# Patient Record
Sex: Female | Born: 1985 | Race: Black or African American | Hispanic: No | Marital: Single | Smoking: Never smoker
Health system: Southern US, Community
[De-identification: ages and names within clinical notes are randomized; demographics above are authoritative.]

## PROBLEM LIST (undated history)

## (undated) ENCOUNTER — Inpatient Hospital Stay (HOSPITAL_COMMUNITY): Payer: Self-pay

## (undated) DIAGNOSIS — R87629 Unspecified abnormal cytological findings in specimens from vagina: Secondary | ICD-10-CM

## (undated) DIAGNOSIS — E669 Obesity, unspecified: Secondary | ICD-10-CM

## (undated) DIAGNOSIS — I10 Essential (primary) hypertension: Secondary | ICD-10-CM

## (undated) HISTORY — PX: CHOLECYSTECTOMY: SHX55

## (undated) HISTORY — PX: LEEP: SHX91

## (undated) HISTORY — PX: BARIATRIC SURGERY: SHX1103

---

## 2005-04-23 ENCOUNTER — Emergency Department (HOSPITAL_COMMUNITY): Admission: EM | Admit: 2005-04-23 | Discharge: 2005-04-23 | Payer: Self-pay | Admitting: Emergency Medicine

## 2009-08-25 ENCOUNTER — Emergency Department (HOSPITAL_BASED_OUTPATIENT_CLINIC_OR_DEPARTMENT_OTHER): Admission: EM | Admit: 2009-08-25 | Discharge: 2009-08-26 | Payer: Self-pay | Admitting: Emergency Medicine

## 2009-08-26 ENCOUNTER — Ambulatory Visit: Payer: Self-pay | Admitting: Diagnostic Radiology

## 2010-07-27 LAB — CBC
HCT: 34.2 % — ABNORMAL LOW (ref 36.0–46.0)
Hemoglobin: 11.4 g/dL — ABNORMAL LOW (ref 12.0–15.0)
WBC: 6.7 10*3/uL (ref 4.0–10.5)

## 2010-07-27 LAB — GC/CHLAMYDIA PROBE AMP, GENITAL
Chlamydia, DNA Probe: NEGATIVE
GC Probe Amp, Genital: NEGATIVE

## 2010-07-27 LAB — DIFFERENTIAL
Basophils Absolute: 0.1 10*3/uL (ref 0.0–0.1)
Basophils Relative: 1 % (ref 0–1)
Lymphs Abs: 2.9 10*3/uL (ref 0.7–4.0)
Monocytes Absolute: 0.3 10*3/uL (ref 0.1–1.0)

## 2010-07-27 LAB — WET PREP, GENITAL

## 2010-07-27 LAB — PREGNANCY, URINE: Preg Test, Ur: NEGATIVE

## 2010-09-13 ENCOUNTER — Emergency Department (HOSPITAL_BASED_OUTPATIENT_CLINIC_OR_DEPARTMENT_OTHER)
Admission: EM | Admit: 2010-09-13 | Discharge: 2010-09-13 | Disposition: A | Discharge status: Home / Self Care | Payer: Self-pay | Attending: Emergency Medicine | Admitting: Emergency Medicine

## 2010-09-13 DIAGNOSIS — N760 Acute vaginitis: Secondary | ICD-10-CM | POA: Insufficient documentation

## 2010-09-13 LAB — WET PREP, GENITAL
Clue Cells Wet Prep HPF POC: NONE SEEN
Trich, Wet Prep: NONE SEEN

## 2011-04-17 ENCOUNTER — Emergency Department (HOSPITAL_BASED_OUTPATIENT_CLINIC_OR_DEPARTMENT_OTHER)
Admission: EM | Admit: 2011-04-17 | Discharge: 2011-04-17 | Disposition: A | Discharge status: Home / Self Care | Payer: Self-pay | Attending: Emergency Medicine | Admitting: Emergency Medicine

## 2011-04-17 ENCOUNTER — Encounter: Payer: Self-pay | Admitting: *Deleted

## 2011-04-17 DIAGNOSIS — L089 Local infection of the skin and subcutaneous tissue, unspecified: Secondary | ICD-10-CM | POA: Insufficient documentation

## 2011-04-17 MED ORDER — CEPHALEXIN 500 MG PO CAPS: 500.0000 mg | ORAL_CAPSULE | Freq: Four times a day (QID) | ORAL | Status: AC

## 2011-04-17 MED ORDER — HYDROCODONE-ACETAMINOPHEN 5-325 MG PO TABS: 2.0000 | ORAL_TABLET | ORAL | Status: AC | PRN

## 2011-04-17 NOTE — ED Provider Notes (Signed)
Evaluation and management procedures were performed by the PA/NP under my supervision/collaboration.    Felisa Bonier, MD 04/17/11 [phone]

## 2011-04-17 NOTE — ED Provider Notes (Signed)
History     CSN: [id number] Arrival date & time: 04/17/2011  8:52 PM   First MD Initiated Contact with Patient 04/17/11 2135      Chief Complaint  Patient presents with  . Hand Pain    (Consider location/radiation/quality/duration/timing/severity/associated sxs/prior treatment) Patient is a 25 y.o. female presenting with hand pain. The history is provided by the patient. No language interpreter was used.  Hand Pain This is a new problem. The current episode started in the past 7 days. The problem occurs constantly. The problem has been unchanged. Associated symptoms include joint swelling. The symptoms are aggravated by nothing. She has tried heat for the symptoms. The treatment provided no relief.  Pt complains of swelling and pain to the tip of her finger.  Pt has been soaking without relief  History reviewed. No pertinent past medical history.  History reviewed. No pertinent past surgical history.  History reviewed. No pertinent family history.  History  Substance Use Topics  . Smoking status: Not on file  . Smokeless tobacco: Not on file  . Alcohol Use: Not on file    OB History    Grav Para Term Preterm Abortions TAB SAB Ect Mult Living                  Review of Systems  Musculoskeletal: Positive for joint swelling.  All other systems reviewed and are negative.    Allergies  Darvocet  Home Medications   Current Outpatient Rx  Name Route Sig Dispense Refill  . ACETAMINOPHEN 500 MG PO TABS Oral Take 1,000 mg by mouth every 6 (six) hours as needed. For pain     . ALBUTEROL SULFATE HFA 108 (90 BASE) MCG/ACT IN AERS Inhalation Inhale 2 puffs into the lungs every 6 (six) hours as needed. For shortness of breath and wheezing     . IBUPROFEN 200 MG PO TABS Oral Take 600 mg by mouth every 6 (six) hours as needed. For pain       BP 121/68  Pulse 93  Temp 98.1 F (36.7 C)  Resp 18  Ht 5\' 2"  (1.575 m)  Wt 250 lb (113.399 kg)  BMI 45.73 kg/m2  SpO2 100%  LMP  03/31/2011  Physical Exam  Vitals reviewed. Constitutional: She appears well-developed.  HENT:  Head: Normocephalic.  Musculoskeletal: She exhibits edema and tenderness.       Tender tip of 4th finger,  No obv pus pocket,    Skin: Skin is warm.  Psychiatric: She has a normal mood and affect.    ED Course  Procedures (including critical care time)  Labs Reviewed - No data to display No results found.   No diagnosis found.    MDM  Pt advised soak.  I will treat with keflex.  Pt request medication for pain        Langston Masker, [place] 04/17/11 2225

## 2011-04-17 NOTE — ED Notes (Signed)
Pt states she has had pain and swelling to her right ring finger since Wed. Types for a living. Tried soaks and OTC meds without relief.

## 2011-04-24 ENCOUNTER — Encounter (HOSPITAL_BASED_OUTPATIENT_CLINIC_OR_DEPARTMENT_OTHER): Payer: Self-pay | Admitting: *Deleted

## 2011-04-24 ENCOUNTER — Emergency Department (HOSPITAL_BASED_OUTPATIENT_CLINIC_OR_DEPARTMENT_OTHER)
Admission: EM | Admit: 2011-04-24 | Discharge: 2011-04-24 | Disposition: A | Discharge status: Home / Self Care | Payer: Self-pay | Attending: Emergency Medicine | Admitting: Emergency Medicine

## 2011-04-24 DIAGNOSIS — IMO0002 Reserved for concepts with insufficient information to code with codable children: Secondary | ICD-10-CM | POA: Insufficient documentation

## 2011-04-24 MED ORDER — LIDOCAINE HCL 2 % IJ SOLN
INTRAMUSCULAR | Status: AC
  Filled 2011-04-24: qty 1

## 2011-04-24 MED ORDER — LIDOCAINE HCL 2 % IJ SOLN: 20.0000 mL | Freq: Once | INTRAMUSCULAR | Status: DC

## 2011-04-24 NOTE — ED Provider Notes (Signed)
History     CSN: [id number] Arrival date & time: 04/24/2011  2:45 PM   First MD Initiated Contact with Patient 04/24/11 1508      Chief Complaint  Patient presents with  . Hand Pain    (Consider location/radiation/quality/duration/timing/severity/associated sxs/prior treatment) Patient is a 25 y.o. female presenting with hand pain. The history is provided by the patient. No language interpreter was used.  Hand Pain This is a new problem. The current episode started in the past 7 days. The problem occurs constantly. The problem has been gradually worsening. Pertinent negatives include no fever. Exacerbated by: palpation. Treatments tried: keflex. The treatment provided no relief.    History reviewed. No pertinent past medical history.  History reviewed. No pertinent past surgical history.  History reviewed. No pertinent family history.  History  Substance Use Topics  . Smoking status: Not on file  . Smokeless tobacco: Not on file  . Alcohol Use: Not on file    OB History    Grav Para Term Preterm Abortions TAB SAB Ect Mult Living                  Review of Systems  Constitutional: Negative.  Negative for fever.  Eyes: Negative.   Respiratory: Negative.   Cardiovascular: Negative.   Skin: Positive for wound.    Allergies  Darvocet  Home Medications   Current Outpatient Rx  Name Route Sig Dispense Refill  . ACETAMINOPHEN 500 MG PO TABS Oral Take 1,000 mg by mouth every 6 (six) hours as needed. For pain     . ALBUTEROL SULFATE HFA 108 (90 BASE) MCG/ACT IN AERS Inhalation Inhale 2 puffs into the lungs every 6 (six) hours as needed. For shortness of breath and wheezing     . CEPHALEXIN 500 MG PO CAPS Oral Take 1 capsule (500 mg total) by mouth 4 (four) times daily. 28 capsule 0  . HYDROCODONE-ACETAMINOPHEN 5-325 MG PO TABS Oral Take 2 tablets by mouth every 4 (four) hours as needed for pain. 10 tablet 0  . IBUPROFEN 200 MG PO TABS Oral Take 600 mg by mouth every 6  (six) hours as needed. For pain       BP 124/86  Pulse 96  Temp(Src) 98.9 F (37.2 C) (Oral)  Resp 18  Ht 5\' 2"  (1.575 m)  Wt 250 lb (113.399 kg)  BMI 45.73 kg/m2  SpO2 100%  LMP 04/07/2011  Physical Exam  Nursing note and vitals reviewed. Constitutional: She appears well-developed and well-nourished.  Cardiovascular: Normal rate and regular rhythm.   Pulmonary/Chest: Effort normal and breath sounds normal.  Musculoskeletal: Normal range of motion.  Neurological: She is alert.  Skin:       Pt has a fluctuant are to medial aspect of the right ring fingernail that is yellowish green in color    ED Course  Drain paronychia Date/Time: 04/24/2011 4:29 PM Performed by: Teressa Lower Authorized by: Teressa Lower Consent: Verbal consent obtained. Written consent not obtained. Risks and benefits: risks, benefits and alternatives were discussed Consent given by: patient Patient understanding: patient states understanding of the procedure being performed Patient identity confirmed: verbally with patient Time out: Immediately prior to procedure a "time out" was called to verify the correct patient, procedure, equipment, support staff and site/side marked as required. Local anesthesia used: no Patient tolerance: Patient tolerated the procedure well with no immediate complications. Comments: Drained with 18 gauge needle   (including critical care time)  Labs Reviewed - No data to  display No results found.   1. Paronychia       MDM  Area drained don't think anything further needs to be done at this time        Teressa Lower, NP 04/24/11 1629

## 2011-04-24 NOTE — ED Provider Notes (Signed)
Medical screening examination/treatment/procedure(s) were performed by non-physician practitioner and as supervising physician I was immediately available for consultation/collaboration.  Onia Shiflett T Sahand Gosch, MD 04/24/11 2032 

## 2011-04-24 NOTE — ED Notes (Signed)
Pt was here [place] a week ago. Right ring  finger is more tender and has purulent drainage.

## 2011-07-09 ENCOUNTER — Emergency Department (HOSPITAL_BASED_OUTPATIENT_CLINIC_OR_DEPARTMENT_OTHER)
Admission: EM | Admit: 2011-07-09 | Discharge: 2011-07-09 | Disposition: A | Discharge status: Home / Self Care | Payer: Medicaid Other | Attending: Emergency Medicine | Admitting: Emergency Medicine

## 2011-07-09 ENCOUNTER — Encounter (HOSPITAL_BASED_OUTPATIENT_CLINIC_OR_DEPARTMENT_OTHER): Payer: Self-pay | Admitting: *Deleted

## 2011-07-09 DIAGNOSIS — Z331 Pregnant state, incidental: Secondary | ICD-10-CM

## 2011-07-09 DIAGNOSIS — O2390 Unspecified genitourinary tract infection in pregnancy, unspecified trimester: Secondary | ICD-10-CM | POA: Insufficient documentation

## 2011-07-09 DIAGNOSIS — M549 Dorsalgia, unspecified: Secondary | ICD-10-CM | POA: Insufficient documentation

## 2011-07-09 DIAGNOSIS — N390 Urinary tract infection, site not specified: Secondary | ICD-10-CM | POA: Insufficient documentation

## 2011-07-09 DIAGNOSIS — J45909 Unspecified asthma, uncomplicated: Secondary | ICD-10-CM | POA: Insufficient documentation

## 2011-07-09 DIAGNOSIS — R197 Diarrhea, unspecified: Secondary | ICD-10-CM | POA: Insufficient documentation

## 2011-07-09 LAB — URINALYSIS, ROUTINE W REFLEX MICROSCOPIC
Nitrite: NEGATIVE
Urobilinogen, UA: 0.2 mg/dL (ref 0.0–1.0)
pH: 6 (ref 5.0–8.0)

## 2011-07-09 LAB — PREGNANCY, URINE: Preg Test, Ur: POSITIVE — AB

## 2011-07-09 LAB — URINE MICROSCOPIC-ADD ON

## 2011-07-09 MED ORDER — NITROFURANTOIN MONOHYD MACRO 100 MG PO CAPS
100.0000 mg | ORAL_CAPSULE | Freq: Once | ORAL | Status: AC
  Administered 2011-07-09: 100 mg via ORAL
  Filled 2011-07-09: qty 1

## 2011-07-09 MED ORDER — ONDANSETRON 4 MG PO TBDP: 4.0000 mg | ORAL_TABLET | Freq: Three times a day (TID) | ORAL | Status: AC | PRN

## 2011-07-09 MED ORDER — NITROFURANTOIN MONOHYD MACRO 100 MG PO CAPS: 100.0000 mg | ORAL_CAPSULE | Freq: Two times a day (BID) | ORAL | Status: AC

## 2011-07-09 MED ORDER — NITROFURANTOIN MONOHYD MACRO 100 MG PO CAPS: 100.0000 mg | ORAL_CAPSULE | Freq: Two times a day (BID) | ORAL | Status: DC

## 2011-07-09 MED ORDER — ALBUTEROL SULFATE HFA 108 (90 BASE) MCG/ACT IN AERS: 1.0000 | INHALATION_SPRAY | Freq: Four times a day (QID) | RESPIRATORY_TRACT | Status: DC | PRN

## 2011-07-09 NOTE — ED Notes (Signed)
Pt taking/texting on cell phone while in triage.

## 2011-07-09 NOTE — ED Notes (Signed)
Pt presents to ED today with back pain s/p asthma attack.  Pt states breathing" is fine now"

## 2011-07-09 NOTE — ED Provider Notes (Signed)
Medical screening examination/treatment/procedure(s) were performed by non-physician practitioner and as supervising physician I was immediately available for consultation/collaboration.   Dayton Bailiff, MD 07/09/11 2123

## 2011-07-09 NOTE — ED Provider Notes (Signed)
History     CSN: [id number]  Arrival date & time 07/09/11  1931   First MD Initiated Contact with Patient 07/09/11 1946      Chief Complaint  Patient presents with  . Back Pain    (Consider location/radiation/quality/duration/timing/severity/associated sxs/prior treatment) HPI Comments: pt states that she has had diarrhea since yesterday and one episode of vomiting:pt states that she has had nausea:pt states that she has had urinary frequency:pt states that her last period was 1/31 and she is normally regular:pt denies ever being pregnant previously  Patient is a 26 y.o. female presenting with back pain. The history is provided by the patient. No language interpreter was used.  Back Pain  This is a recurrent problem. The current episode started more than 1 week ago. The problem occurs every several days. The problem has not changed since onset.The pain is associated with no known injury. The pain is present in the lumbar spine. The quality of the pain is described as aching. The pain does not radiate. The pain is moderate. The symptoms are aggravated by bending and certain positions. The pain is the same all the time. Pertinent negatives include no abdominal swelling, no bowel incontinence, no perianal numbness, no dysuria, no tingling and no weakness. She has tried NSAIDs for the symptoms.    History reviewed. No pertinent past medical history.  History reviewed. No pertinent past surgical history.  History reviewed. No pertinent family history.  History  Substance Use Topics  . Smoking status: Not on file  . Smokeless tobacco: Not on file  . Alcohol Use: Not on file    OB History    Grav Para Term Preterm Abortions TAB SAB Ect Mult Living                  Review of Systems  Gastrointestinal: Negative for bowel incontinence.  Genitourinary: Negative for dysuria.  Musculoskeletal: Positive for back pain.  Neurological: Negative for tingling and weakness.  All other  systems reviewed and are negative.    Allergies  Darvocet  Home Medications   Current Outpatient Rx  Name Route Sig Dispense Refill  . ACETAMINOPHEN 500 MG PO TABS Oral Take 1,000 mg by mouth every 6 (six) hours as needed. For pain     . ALBUTEROL SULFATE HFA 108 (90 BASE) MCG/ACT IN AERS Inhalation Inhale 2 puffs into the lungs every 6 (six) hours as needed. For shortness of breath and wheezing     . IBUPROFEN 200 MG PO TABS Oral Take 600 mg by mouth every 6 (six) hours as needed. For pain     . GERITOL TONIC PO LIQD Oral Take 2.5 mLs by mouth daily.      BP 140/80  Pulse 92  Temp(Src) 97.8 F (36.6 C) (Oral)  Resp 20  Ht 5\' 2"  (1.575 m)  Wt 275 lb (124.739 kg)  BMI 50.30 kg/m2  SpO2 100%  Physical Exam  Nursing note and vitals reviewed. Constitutional: She is oriented to person, place, and time. She appears well-developed and well-nourished.  HENT:  Head: Normocephalic and atraumatic.  Eyes: Conjunctivae and EOM are normal.  Neck: Neck supple.  Cardiovascular: Normal rate and regular rhythm.   Pulmonary/Chest: Effort normal and breath sounds normal.  Abdominal: Soft. Bowel sounds are normal. There is no tenderness.  Musculoskeletal: Normal range of motion.       Right lumbar paraspinal tendernes  Neurological: She is alert and oriented to person, place, and time.  Skin: Skin is warm  and dry.  Psychiatric: She has a normal mood and affect.    ED Course  Procedures (including critical care time)  Labs Reviewed  URINALYSIS, ROUTINE W REFLEX MICROSCOPIC - Abnormal; Notable for the following:    APPearance CLOUDY (*)    Leukocytes, UA SMALL (*)    All other components within normal limits  PREGNANCY, URINE - Abnormal; Notable for the following:    Preg Test, Ur POSITIVE (*)    All other components within normal limits  URINE MICROSCOPIC-ADD ON - Abnormal; Notable for the following:    Squamous Epithelial / LPF MANY (*)    Bacteria, UA MANY (*)    All other  components within normal limits  URINE CULTURE   No results found.   1. UTI (lower urinary tract infection)   2. Pregnancy as incidental finding   3. Asthma       MDM  Negative cva tenderness:pt is afebrile:will treat for uti in pregnancy:suspicion for ectopic is ZOX:[id number] basics of pregnancy with pt:pt is okay to follow up with obgyn:pt states that she is out of her inhaler and needs a refill        Teressa Lower, NP 07/09/11 2116

## 2011-07-09 NOTE — Discharge Instructions (Signed)
ABCs of Pregnancy A Antepartum care is very important. Be sure you see your doctor and get prenatal care as soon as you think you are pregnant. At this time, you will be tested for infection, genetic abnormalities and potential problems with you and the pregnancy. This is the time to discuss diet, exercise, work, medications, labor, pain medication during labor and the possibility of a cesarean delivery. Ask any questions that may concern you. It is important to see your doctor regularly throughout your pregnancy. Avoid exposure to toxic substances and chemicals - such as cleaning solvents, lead and mercury, some insecticides, and paint. Pregnant women should avoid exposure to paint fumes, and fumes that cause you to feel ill, dizzy or faint. When possible, it is a good idea to have a pre-pregnancy consultation with your caregiver to begin some important recommendations your caregiver suggests such as, taking folic acid, exercising, quitting smoking, avoiding alcoholic beverages, etc. B Breastfeeding is the healthiest choice for both you and your baby. It has many nutritional benefits for the baby and health benefits for the mother. It also creates a very tight and loving bond between the baby and mother. Talk to your doctor, your family and friends, and your employer about how you choose to feed your baby and how they can support you in your decision. Not all birth defects can be prevented, but a woman can take actions that may increase her chance of having a healthy baby. Many birth defects happen very early in pregnancy, sometimes before a woman even knows she is pregnant. Birth defects or abnormalities of any child in your or the father's family should be discussed with your caregiver. Get a good support bra as your breast size changes. Wear it especially when you exercise and when nursing.  C Celebrate the news of your pregnancy with the your spouse/father and family. Childbirth classes are helpful to  take for you and the spouse/father because it helps to understand what happens during the pregnancy, labor and delivery. Cesarean delivery should be discussed with your doctor so you are prepared for that possibility. The pros and cons of circumcision if it is a boy, should be discussed with your pediatrician. Cigarette smoking during pregnancy can result in low birth weight babies. It has been associated with infertility, miscarriages, tubal pregnancies, infant death (mortality) and poor health (morbidity) in childhood. Additionally, cigarette smoking may cause long-term learning disabilities. If you smoke, you should try to quit before getting pregnant and not smoke during the pregnancy. Secondary smoke may also harm a mother and her developing baby. It is a good idea to ask people to stop smoking around you during your pregnancy and after the baby is born. Extra calcium is necessary when you are pregnant and is found in your prenatal vitamin, in dairy products, green leafy vegetables and in calcium supplements. D A healthy diet according to your current weight and height, along with vitamins and mineral supplements should be discussed with your caregiver. Domestic abuse or violence should be made known to your doctor right away to get the situation corrected. Drink more water when you exercise to keep hydrated. Discomfort of your back and legs usually develops and progresses from the middle of the second trimester through to delivery of the baby. This is because of the enlarging baby and uterus, which may also affect your balance. Do not take illegal drugs. Illegal drugs can seriously harm the baby and you. Drink extra fluids (water is best) throughout pregnancy to help  your body keep up with the increases in your blood volume. Drink at least 6 to 8 glasses of water, fruit juice, or milk each day. A good way to know you are drinking enough fluid is when your urine looks almost like clear water or is very light  yellow.  E Eat healthy to get the nutrients you and your unborn baby need. Your meals should include the five basic food groups. Exercise (30 minutes of light to moderate exercise a day) is important and encouraged during pregnancy, if there are no medical problems or problems with the pregnancy. Exercise that causes discomfort or dizziness should be stopped and reported to your caregiver. Emotions during pregnancy can change from being ecstatic to depression and should be understood by you, your partner and your family. F Fetal screening with ultrasound, amniocentesis and monitoring during pregnancy and labor is common and sometimes necessary. Take 400 micrograms of folic acid daily both before, when possible, and during the first few months of pregnancy to reduce the risk of birth defects of the brain and spine. All women who could possibly become pregnant should take a vitamin with folic acid, every day. It is also important to eat a healthy diet with fortified foods (enriched grain products, including cereals, rice, breads, and pastas) and foods with natural sources of folate (orange juice, green leafy vegetables, beans, peanuts, broccoli, asparagus, peas, and lentils). The father should be involved with all aspects of the pregnancy including, the prenatal care, childbirth classes, labor, delivery, and postpartum time. Fathers may also have emotional concerns about being a father, financial needs, and raising a family. G Genetic testing should be done appropriately. It is important to know your family and the father's history. If there have been problems with pregnancies or birth defects in your family, report these to your doctor. Also, genetic counselors can talk with you about the information you might need in making decisions about having a family. You can call a major medical center in your area for help in finding a board-certified genetic counselor. Genetic testing and counseling should be done  before pregnancy when possible, especially if there is a history of problems in the mother's or father's family. Certain ethnic backgrounds are more at risk for genetic defects. H Get familiar with the hospital where you will be having your baby. Get to know how long it takes to get there, the labor and delivery area, and the hospital procedures. Be sure your medical insurance is accepted there. Get your home ready for the baby including, clothes, the baby's room (when possible), furniture and car seat. Hand washing is important throughout the day, especially after handling raw meat and poultry, changing the baby's diaper or using the bathroom. This can help prevent the spread of many bacteria and viruses that cause infection. Your hair may become dry and thinner, but will return to normal a few weeks after the baby is born. Heartburn is a common problem that can be treated by taking antacids recommended by your caregiver, eating smaller meals 5 or 6 times a day, not drinking liquids when eating, drinking between meals and raising the head of your bed 2 to 3 inches. I Insurance to cover you, the baby, doctor and hospital should be reviewed so that you will be prepared to pay any costs not covered by your insurance plan. If you do not have medical insurance, there are usually clinics and services available for you in your community. Take 30 milligrams of iron during  your pregnancy as prescribed by your doctor to reduce the risk of low red blood cells (anemia) later in pregnancy. All women of childbearing age should eat a diet rich in iron. J There should be a joint effort for the mother, father and any other children to adapt to the pregnancy financially, emotionally, and psychologically during the pregnancy. Join a support group for moms-to-be. Or, join a class on parenting or childbirth. Have the family participate when possible. K Know your limits. Let your caregiver know if you experience any of the  following:   Pain of any kind.   Strong cramps.   You develop a lot of weight in a short period of time (5 pounds in 3 to 5 days).   Vaginal bleeding, leaking of amniotic fluid.   Headache, vision problems.   Dizziness, fainting, shortness of breath.   Chest pain.   Fever of 102 F (38.9 C) or higher.   Gush of clear fluid from your vagina.   Painful urination.   Domestic violence.   Irregular heartbeat (palpitations).   Rapid beating of the heart (tachycardia).   Constant feeling sick to your stomach (nauseous) and vomiting.   Trouble walking, fluid retention (edema).   Muscle weakness.   If your baby has decreased activity.   Persistent diarrhea.   Abnormal vaginal discharge.   Uterine contractions at 20-minute intervals.   Back pain that travels down your leg.  L Learn and practice that what you eat and drink should be in moderation and healthy for you and your baby. Legal drugs such as alcohol and caffeine are important issues for pregnant women. There is no safe amount of alcohol a woman can drink while pregnant. Fetal alcohol syndrome, a disorder characterized by growth retardation, facial abnormalities, and central nervous system dysfunction, is caused by a woman's use of alcohol during pregnancy. Caffeine, found in tea, coffee, soft drinks and chocolate, should also be limited. Be sure to read labels when trying to cut down on caffeine during pregnancy. More than 200 foods, beverages, and over-the-counter medications contain caffeine and have a high salt content! There are coffees and teas that do not contain caffeine. M Medical conditions such as diabetes, epilepsy, and high blood pressure should be treated and kept under control before pregnancy when possible, but especially during pregnancy. Ask your caregiver about any medications that may need to be changed or adjusted during pregnancy. If you are currently taking any medications, ask your caregiver if it  is safe to take them while you are pregnant or before getting pregnant when possible. Also, be sure to discuss any herbs or vitamins you are taking. They are medicines, too! Discuss with your doctor all medications, prescribed and over-the-counter, that you are taking. During your prenatal visit, discuss the medications your doctor may give you during labor and delivery. N Never be afraid to ask your doctor or caregiver questions about your health, the progress of the pregnancy, family problems, stressful situations, and recommendation for a pediatrician, if you do not have one. It is better to take all precautions and discuss any questions or concerns you may have during your office visits. It is a good idea to write down your questions before you visit the doctor. O Over-the-counter cough and cold remedies may contain alcohol or other ingredients that should be avoided during pregnancy. Ask your caregiver about prescription, herbs or over-the-counter medications that you are taking or may consider taking while pregnant.  P Physical activity during pregnancy can  benefit both you and your baby by lessening discomfort and fatigue, providing a sense of well-being, and increasing the likelihood of early recovery after delivery. Light to moderate exercise during pregnancy strengthens the belly (abdominal) and back muscles. This helps improve posture. Practicing yoga, walking, swimming, and cycling on a stationary bicycle are usually safe exercises for pregnant women. Avoid scuba diving, exercise at high altitudes (over 3000 feet), skiing, horseback riding, contact sports, etc. Always check with your doctor before beginning any kind of exercise, especially during pregnancy and especially if you did not exercise before getting pregnant. Q Queasiness, stomach upset and morning sickness are common during pregnancy. Eating a couple of crackers or dry toast before getting out of bed. Foods that you normally love may  make you feel sick to your stomach. You may need to substitute other nutritious foods. Eating 5 or 6 small meals a day instead of 3 large ones may make you feel better. Do not drink with your meals, drink between meals. Questions that you have should be written down and asked during your prenatal visits. R Read about and make plans to baby-proof your home. There are important tips for making your home a safer environment for your baby. Review the tips and make your home safer for you and your baby. Read food labels regarding calories, salt and fat content in the food. S Saunas, hot tubs, and steam rooms should be avoided while you are pregnant. Excessive high heat may be harmful during your pregnancy. Your caregiver will screen and examine you for sexually transmitted diseases and genetic disorders during your prenatal visits. Learn the signs of labor. Sexual relations while pregnant is safe unless there is a medical or pregnancy problem and your caregiver advises against it. T Traveling long distances should be avoided especially in the third trimester of your pregnancy. If you do have to travel out of state, be sure to take a copy of your medical records and medical insurance plan with you. You should not travel long distances without seeing your doctor first. Most airlines will not allow you to travel after 36 weeks of pregnancy. Toxoplasmosis is an infection caused by a parasite that can seriously harm an unborn baby. Avoid eating undercooked meat and handling cat litter. Be sure to wear gloves when gardening. Tingling of the hands and fingers is not unusual and is due to fluid retention. This will go away after the baby is born. U Womb (uterus) size increases during the first trimester. Your kidneys will begin to function more efficiently. This may cause you to feel the need to urinate more often. You may also leak urine when sneezing, coughing or laughing. This is due to the growing uterus pressing  against your bladder, which lies directly in front of and slightly under the uterus during the first few months of pregnancy. If you experience burning along with frequency of urination or bloody urine, be sure to tell your doctor. The size of your uterus in the third trimester may cause a problem with your balance. It is advisable to maintain good posture and avoid wearing high heels during this time. An ultrasound of your baby may be necessary during your pregnancy and is safe for you and your baby. V Vaccinations are an important concern for pregnant women. Get needed vaccines before pregnancy. Center for Disease Control (http://www.wolf.info/) has clear guidelines for the use of vaccines during pregnancy. Review the list, be sure to discuss it with your doctor. Prenatal vitamins are helpful  and healthy for you and the baby. Do not take extra vitamins except what is recommended. Taking too much of certain vitamins can cause overdose problems. Continuous vomiting should be reported to your caregiver. Varicose veins may appear especially if there is a family history of varicose veins. They should subside after the delivery of the baby. Support hose helps if there is leg discomfort. W Being overweight or underweight during pregnancy may cause problems. Try to get within 15 pounds of your ideal weight before pregnancy. Remember, pregnancy is not a time to be dieting! Do not stop eating or start skipping meals as your weight increases. Both you and your baby need the calories and nutrition you receive from a healthy diet. Be sure to consult with your doctor about your diet. There is a formula and diet plan available depending on whether you are overweight or underweight. Your caregiver or nutritionist can help and advise you if necessary. X Avoid X-rays. If you must have dental work or diagnostic tests, tell your dentist or physician that you are pregnant so that extra care can be taken. X-rays should only be taken when  the risks of not taking them outweigh the risk of taking them. If needed, only the minimum amount of radiation should be used. When X-rays are necessary, protective lead shields should be used to cover areas of the body that are not being X-rayed. Y Your baby loves you. Breastfeeding your baby creates a loving and very close bond between the two of you. Give your baby a healthy environment to live in while you are pregnant. Infants and children require constant care and guidance. Their health and safety should be carefully watched at all times. After the baby is born, rest or take a nap when the baby is sleeping. Z Get your ZZZs. Be sure to get plenty of rest. Resting on your side as often as possible, especially on your left side is advised. It provides the best circulation to your baby and helps reduce swelling. Try taking a nap for 30 to 45 minutes in the afternoon when possible. After the baby is born rest or take a nap when the baby is sleeping. Try elevating your feet for that amount of time when possible. It helps the circulation in your legs and helps reduce swelling.  Most information courtesy of the CDC. Document Released: 04/25/2005 Document Revised: 01/05/2011 Document Reviewed: 01/07/2009 [facility] Patient Information 2012 [place], [place].Asthma Attack Prevention HOW CAN ASTHMA BE PREVENTED? Currently, there is no way to prevent asthma from starting. However, you can take steps to control the disease and prevent its symptoms after you have been diagnosed. Learn about your asthma and how to control it. Take an active role to control your asthma by working with your caregiver to create and follow an asthma action plan. An asthma action plan guides you in taking your medicines properly, avoiding factors that make your asthma worse, tracking your level of asthma control, responding to worsening asthma, and seeking emergency care when needed. To track your asthma, keep records of your symptoms, check  your peak flow number using a peak flow meter (handheld device that shows how well air moves out of your lungs), and get regular asthma checkups.  Other ways to prevent asthma attacks include:  Use medicines as your caregiver directs.   Identify and avoid things that make your asthma worse (as much as you can).   Keep track of your asthma symptoms and level of control.   Get  regular checkups for your asthma.   With your caregiver, write a detailed plan for taking medicines and managing an asthma attack. Then be sure to follow your action plan. Asthma is an ongoing condition that needs regular monitoring and treatment.   Identify and avoid asthma triggers. A number of outdoor allergens and irritants (pollen, mold, cold air, air pollution) can trigger asthma attacks. Find out what causes or makes your asthma worse, and take steps to avoid those triggers (see below).   Monitor your breathing. Learn to recognize warning signs of an attack, such as slight coughing, wheezing or shortness of breath. However, your lung function may already decrease before you notice any signs or symptoms, so regularly measure and record your peak airflow with a home peak flow meter.   Identify and treat attacks early. If you act quickly, you're less likely to have a severe attack. You will also need less medicine to control your symptoms. When your peak flow measurements decrease and alert you to an upcoming attack, take your medicine as instructed, and immediately stop any activity that may have triggered the attack. If your symptoms do not improve, get medical help.   Pay attention to increasing quick-relief inhaler use. If you find yourself relying on your quick-relief inhaler (such as albuterol), your asthma is not under control. See your caregiver about adjusting your treatment.  IDENTIFY AND CONTROL FACTORS THAT MAKE YOUR ASTHMA WORSE A number of common things can set off or make your asthma symptoms worse (asthma  triggers). Keep track of your asthma symptoms for several weeks, detailing all the environmental and emotional factors that are linked with your asthma. When you have an asthma attack, go back to your asthma diary to see which factor, or combination of factors, might have contributed to it. Once you know what these factors are, you can take steps to control many of them.  Allergies: If you have allergies and asthma, it is important to take asthma prevention steps at home. Asthma attacks (worsening of asthma symptoms) can be triggered by allergies, which can cause temporary increased inflammation of your airways. Minimizing contact with the substance to which you are allergic will help prevent an asthma attack. Animal Dander:   Some people are allergic to the flakes of skin or dried saliva from animals with fur or feathers. Keep these pets out of your home.   If you can't keep a pet outdoors, keep the pet out of your bedroom and other sleeping areas at all times, and keep the door closed.   Remove carpets and furniture covered with cloth from your home. If that is not possible, keep the pet away from fabric-covered furniture and carpets.  Dust Mites:  Many people with asthma are allergic to dust mites. Dust mites are tiny bugs that are found in every home, in mattresses, pillows, carpets, fabric-covered furniture, bedcovers, clothes, stuffed toys, fabric, and other fabric-covered items.   Cover your mattress in a special dust-proof cover.   Cover your pillow in a special dust-proof cover, or wash the pillow each week in hot water. Water must be hotter than 130 F to kill dust mites. Cold or warm water used with detergent and bleach can also be effective.   Wash the sheets and blankets on your bed each week in hot water.   Try not to sleep or lie on cloth-covered cushions.   Call ahead when traveling and ask for a smoke-free hotel room. Bring your own bedding and pillows, in case  the hotel only  supplies feather pillows and down comforters, which may contain dust mites and cause asthma symptoms.   Remove carpets from your bedroom and those laid on concrete, if you can.   Keep stuffed toys out of the bed, or wash the toys weekly in hot water or cooler water with detergent and bleach.  Cockroaches:  Many people with asthma are allergic to the droppings and remains of cockroaches.   Keep food and garbage in closed containers. Never leave food out.   Use poison baits, traps, powders, gels, or paste (for example, boric acid).   If a spray is used to kill cockroaches, stay out of the room until the odor goes away.  Indoor Mold:  Fix leaky faucets, pipes, or other sources of water that have mold around them.   Clean moldy surfaces with a cleaner that has bleach in it.  Pollen and Outdoor Mold:  When pollen or mold spore counts are high, try to keep your windows closed.   Stay indoors with windows closed from late morning to afternoon, if you can. Pollen and some mold spore counts are highest at that time.   Ask your caregiver whether you need to take or increase anti-inflammatory medicine before your allergy season starts.  Irritants:   Tobacco smoke is an irritant. If you smoke, ask your caregiver how you can quit. Ask family members to quit smoking, too. Do not allow smoking in your home or car.   If possible, do not use a wood-burning stove, kerosene heater, or fireplace. Minimize exposure to all sources of smoke, including incense, candles, fires, and fireworks.   Try to stay away from strong odors and sprays, such as perfume, talcum powder, hair spray, and paints.   Decrease humidity in your home and use an indoor air cleaning device. Reduce indoor humidity to below 60 percent. Dehumidifiers or central air conditioners can do this.   Try to have someone else vacuum for you once or twice a week, if you can. Stay out of rooms while they are being vacuumed and for a short  while afterward.   If you vacuum, use a dust mask from a hardware store, a double-layered or microfilter vacuum cleaner bag, or a vacuum cleaner with a HEPA filter.   Sulfites in foods and beverages can be irritants. Do not drink beer or wine, or eat dried fruit, processed potatoes, or shrimp if they cause asthma symptoms.   Cold air can trigger an asthma attack. Cover your nose and mouth with a scarf on cold or windy days.   Several health conditions can make asthma more difficult to manage, including runny nose, sinus infections, reflux disease, psychological stress, and sleep apnea. Your caregiver will treat these conditions, as well.   Avoid close contact with people who have a cold or the flu, since your asthma symptoms may get worse if you catch the infection from them. Wash your hands thoroughly after touching items that may have been handled by people with a respiratory infection.   Get a flu shot every year to protect against the flu virus, which often makes asthma worse for days or weeks. Also get a pneumonia shot once every five to 10 years.  Drugs:  Aspirin and other painkillers can cause asthma attacks. 10% to 20% of people with asthma have sensitivity to aspirin or a group of painkillers called non-steroidal anti-inflammatory drugs (NSAIDS), such as ibuprofen and naproxen. These drugs are used to treat pain and reduce fevers.  Asthma attacks caused by any of these medicines can be severe and even fatal. These drugs must be avoided in people who have known aspirin sensitive asthma. Products with acetaminophen are considered safe for people who have asthma. It is important that people with aspirin sensitivity read labels of all over-the-counter drugs used to treat pain, colds, coughs, and fever.   Beta blockers and ACE inhibitors are other drugs which you should discuss with your caregiver, in relation to your asthma.  ALLERGY SKIN TESTING  Ask your asthma caregiver about allergy skin  testing or blood testing (RAST test) to identify the allergens to which you are sensitive. If you are found to have allergies, allergy shots (immunotherapy) for asthma may help prevent future allergies and asthma. With allergy shots, small doses of allergens (substances to which you are allergic) are injected under your skin on a regular schedule. Over a period of time, your body may become used to the allergen and less responsive with asthma symptoms. You can also take measures to minimize your exposure to those allergens. EXERCISE  If you have exercise-induced asthma, or are planning vigorous exercise, or exercise in cold, humid, or dry environments, prevent exercise-induced asthma by following your caregiver's advice regarding asthma treatment before exercising. Document Released: 04/13/2009 Document Revised: 01/05/2011 Document Reviewed: 04/13/2009 [facility] Patient Information 2012 [place], [place].

## 2011-07-09 NOTE — ED Notes (Signed)
Pt refuses to undress and will only sit in chair.

## 2011-07-11 LAB — URINE CULTURE: Culture  Setup Time: 201303022331

## 2011-07-25 ENCOUNTER — Encounter (HOSPITAL_BASED_OUTPATIENT_CLINIC_OR_DEPARTMENT_OTHER): Payer: Self-pay | Admitting: *Deleted

## 2011-07-25 ENCOUNTER — Emergency Department (HOSPITAL_BASED_OUTPATIENT_CLINIC_OR_DEPARTMENT_OTHER)
Admission: EM | Admit: 2011-07-25 | Discharge: 2011-07-25 | Disposition: A | Discharge status: Home / Self Care | Payer: Medicaid Other | Attending: Emergency Medicine | Admitting: Emergency Medicine

## 2011-07-25 DIAGNOSIS — R1012 Left upper quadrant pain: Secondary | ICD-10-CM | POA: Insufficient documentation

## 2011-07-25 DIAGNOSIS — R109 Unspecified abdominal pain: Secondary | ICD-10-CM | POA: Insufficient documentation

## 2011-07-25 DIAGNOSIS — O99891 Other specified diseases and conditions complicating pregnancy: Secondary | ICD-10-CM | POA: Insufficient documentation

## 2011-07-25 LAB — WET PREP, GENITAL
Trich, Wet Prep: NONE SEEN
Yeast Wet Prep HPF POC: NONE SEEN

## 2011-07-25 LAB — URINALYSIS, ROUTINE W REFLEX MICROSCOPIC
Bilirubin Urine: NEGATIVE
Glucose, UA: NEGATIVE mg/dL
Protein, ur: NEGATIVE mg/dL
Urobilinogen, UA: 0.2 mg/dL (ref 0.0–1.0)
pH: 6.5 (ref 5.0–8.0)

## 2011-07-25 NOTE — ED Provider Notes (Signed)
History    This chart was scribed for Alexis Chick, MD, MD by Smitty Pluck. The patient was seen in room MH01 and the patient's care was started at 6:11PM.   CSN: [id number]  Arrival date & time 07/25/11  1634   First MD Initiated Contact with Patient 07/25/11 1801      Chief Complaint  Patient presents with  . Abdominal Pain    (Consider location/radiation/quality/duration/timing/severity/associated sxs/prior treatment) Patient is a 26 y.o. female presenting with abdominal pain. The history is provided by the patient.  Abdominal Pain The primary symptoms of the illness include abdominal pain.   Alexis Blake is a 26 y.o. female who presents to the Emergency Department complaining of sharp abdominal pain onset 1 day ago. Pt reports that she has had abdominal cramps onset 1 month ago. Pt denies fever, vaginal bleeding, vomiting, cough. Symptoms have been intermittent since onset without radiation. LMP was Jan 31-2013. The pt is [redacted] weeks pregnant. This is the first pregnancy. Pt denies any other pregnancy problems.   The pain is intermittent and is located in left mid abdomen and left upper abdomen.  Pt is currently having no pain.  She has not taken any medications prior to arrival for her pain. She is G1P0  Past Medical History  Diagnosis Date  . Asthma     History reviewed. No pertinent past surgical history.  No family history on file.  History  Substance Use Topics  . Smoking status: Never Smoker   . Smokeless tobacco: Not on file  . Alcohol Use: No    OB History    Grav Para Term Preterm Abortions TAB SAB Ect Mult Living   1               Review of Systems  Gastrointestinal: Positive for abdominal pain.  All other systems reviewed and are negative.   10 Systems reviewed and are negative for acute change except as noted in the HPI.  Allergies  Darvocet  Home Medications   Current Outpatient Rx  Name Route Sig Dispense Refill  . ALBUTEROL SULFATE HFA  108 (90 BASE) MCG/ACT IN AERS Inhalation Inhale 2 puffs into the lungs every 6 (six) hours as needed. For shortness of breath and wheezing     . ALBUTEROL SULFATE HFA 108 (90 BASE) MCG/ACT IN AERS Inhalation Inhale 1-2 puffs into the lungs every 6 (six) hours as needed for wheezing. 1 Inhaler 0  . IBUPROFEN 200 MG PO TABS Oral Take 600 mg by mouth every 6 (six) hours as needed. For pain     . GERITOL TONIC PO LIQD Oral Take 2.5 mLs by mouth daily.    Marland Kitchen PRENATAL MULTIVITAMIN CH Oral Take 1 tablet by mouth daily.    . ACETAMINOPHEN 500 MG PO TABS Oral Take 1,000 mg by mouth every 6 (six) hours as needed. For pain       BP 126/66  Pulse 81  Temp(Src) 97.3 F (36.3 C) (Oral)  Resp 19  Wt 215 lb (97.523 kg)  SpO2 98%  LMP 06/09/2011 Vitals revieweed Physical Exam  Nursing note and vitals reviewed. Constitutional: She is oriented to person, place, and time. She appears well-developed and well-nourished. No distress.  HENT:  Head: Normocephalic and atraumatic.  Eyes: Conjunctivae are normal. Pupils are equal, round, and reactive to light.  Neck: Normal range of motion.  Cardiovascular: Normal rate, regular rhythm and normal heart sounds.   Pulmonary/Chest: Effort normal and breath sounds normal. No respiratory distress.  Abdominal: Soft. She exhibits no distension.  Neurological: She is alert and oriented to person, place, and time.  Skin: Skin is warm and dry.  Psychiatric: She has a normal mood and affect. Her behavior is normal.  Note- abdomen, soft, nontender, nabs, nd Pelvic- OS close, no CMT, no adnexal tenderness or mass  ED Course  Procedures (including critical care time) DIAGNOSTIC STUDIES: Oxygen Saturation is 100% on room air, normal by my interpretation.    COORDINATION OF CARE: 6:14PM EDP discusses pt ED treatment course with pt   Labs Reviewed  URINALYSIS, ROUTINE W REFLEX MICROSCOPIC - Abnormal; Notable for the following:    APPearance CLOUDY (*)    Leukocytes,  UA SMALL (*)    All other components within normal limits  URINE MICROSCOPIC-ADD ON - Abnormal; Notable for the following:    Squamous Epithelial / LPF FEW (*)    All other components within normal limits  PREGNANCY, URINE - Abnormal; Notable for the following:    Preg Test, Ur POSITIVE (*)    All other components within normal limits  WET PREP, GENITAL - Abnormal; Notable for the following:    Clue Cells Wet Prep HPF POC FEW (*)    WBC, Wet Prep HPF POC RARE (*)    All other components within normal limits  GC/CHLAMYDIA PROBE AMP, GENITAL   US Ob Comp Less 14 Wks  07/26/2011  *RADIOLOGY REPORT*  Clinical Data: Pregnant.  Estimated gestational age by LMP is 6 weeks 5 days.  OBSTETRIC <14 WK [place] AND TRANSVAGINAL OB US  Technique:  Both transabdominal and transvaginal ultrasound examinations were performed for complete evaluation of the gestation as well as the maternal uterus, adnexal regions, and pelvic cul-de-sac.  Transvaginal technique was performed to assess early pregnancy.  Comparison:  Pelvic ultrasound 08/26/2009  Intrauterine gestational sac:  Visualized Yolk sac: Visualized Embryo: Visualized Cardiac Activity: Visualized on real-time ultrasound Heart Rate: 121 bpm  MSD: 18.3 mm 6w 5d CRL: 5.74mm  6 w 2d            [place] EDC: 03/16/2012  Maternal uterus/adnexae: Right ovary is not visualized.  The left ovary measures 3.3 x 3.0 x 3.1 cm and contains a probable 1.6 cm corpus luteum.  No adnexal mass is identified.  A 1.2 x 1.0 x 0.8 cm hypoechoic area in the anterior uterine body likely reflects a small you intramural fibroid.  No evidence of subchorionic hemorrhage.  No free pelvic fluid identified.  IMPRESSION:  1.  Single living intrauterine gestation.  Estimated gestational age by crown-rump length of 6 weeks 2 days correlates well with dating by LMP of 6 weeks 5 days. 2.  Left ovary within normal limits.  Nonvisualization of the right ovary. 3.  Small 1.2 cm intramural uterine fibroid.   Ultrasound for fetal anatomic evaluation at 18 to [redacted] weeks gestational age is recommended.  Original Report Authenticated By: Britta Mccreedy, M.D.   US Ob Transvaginal  07/26/2011  *RADIOLOGY REPORT*  Clinical Data: Pregnant.  Estimated gestational age by LMP is 6 weeks 5 days.  OBSTETRIC <14 WK [place] AND TRANSVAGINAL OB US  Technique:  Both transabdominal and transvaginal ultrasound examinations were performed for complete evaluation of the gestation as well as the maternal uterus, adnexal regions, and pelvic cul-de-sac.  Transvaginal technique was performed to assess early pregnancy.  Comparison:  Pelvic ultrasound 08/26/2009  Intrauterine gestational sac:  Visualized Yolk sac: Visualized Embryo: Visualized Cardiac Activity: Visualized on real-time ultrasound Heart Rate: 121 bpm  MSD: 18.3 mm 6w 5d CRL: 5.64mm  6 w 2d            [place] EDC: 03/16/2012  Maternal uterus/adnexae: Right ovary is not visualized.  The left ovary measures 3.3 x 3.0 x 3.1 cm and contains a probable 1.6 cm corpus luteum.  No adnexal mass is identified.  A 1.2 x 1.0 x 0.8 cm hypoechoic area in the anterior uterine body likely reflects a small you intramural fibroid.  No evidence of subchorionic hemorrhage.  No free pelvic fluid identified.  IMPRESSION:  1.  Single living intrauterine gestation.  Estimated gestational age by crown-rump length of 6 weeks 2 days correlates well with dating by LMP of 6 weeks 5 days. 2.  Left ovary within normal limits.  Nonvisualization of the right ovary. 3.  Small 1.2 cm intramural uterine fibroid.  Ultrasound for fetal anatomic evaluation at 18 to [redacted] weeks gestational age is recommended.  Original Report Authenticated By: Britta Mccreedy, M.D.     1. Abdominal pain       MDM  Pt G1P0 presenting with abdominal pain in left mid/upper abdomen, no lower abdominal pain.  Pelvic exam reveals no tenderness or CMT.  I have arranged for her to have a pelvic ultrasound in AM since ultrasound is not available at this  facility tonight- given that she has no pain on exam or pelvic exam I believe this is reasonable.  She was also strongly encouraged to arrange for prenatal care.  She is agreeable with this plan and was given strict return precautions.    I personally performed the services described in this documentation, which was scribed in my presence. The recorded information has been reviewed and considered.        Alexis Chick, MD 07/26/11 [phone]

## 2011-07-25 NOTE — ED Notes (Signed)
Abdominal cramps for about a month. LMP January 31st. [redacted] weeks pregnant. Does not have a OB. No vaginal bleeding.

## 2011-07-26 ENCOUNTER — Ambulatory Visit (HOSPITAL_BASED_OUTPATIENT_CLINIC_OR_DEPARTMENT_OTHER)
Admission: RE | Admit: 2011-07-26 | Discharge: 2011-07-26 | Disposition: A | Discharge status: Home / Self Care | Payer: Medicaid Other | Source: Ambulatory Visit | Attending: Emergency Medicine | Admitting: Emergency Medicine

## 2011-07-26 ENCOUNTER — Encounter (HOSPITAL_BASED_OUTPATIENT_CLINIC_OR_DEPARTMENT_OTHER): Payer: Self-pay

## 2011-07-26 ENCOUNTER — Ambulatory Visit (INDEPENDENT_AMBULATORY_CARE_PROVIDER_SITE_OTHER)
Admit: 2011-07-26 | Discharge: 2011-07-26 | Disposition: A | Discharge status: Home / Self Care | Payer: Medicaid Other | Attending: Emergency Medicine | Admitting: Emergency Medicine

## 2011-07-26 DIAGNOSIS — R109 Unspecified abdominal pain: Secondary | ICD-10-CM

## 2011-07-26 DIAGNOSIS — Z331 Pregnant state, incidental: Secondary | ICD-10-CM

## 2011-07-26 DIAGNOSIS — D251 Intramural leiomyoma of uterus: Secondary | ICD-10-CM

## 2011-07-26 DIAGNOSIS — O3410 Maternal care for benign tumor of corpus uteri, unspecified trimester: Secondary | ICD-10-CM | POA: Insufficient documentation

## 2011-07-26 DIAGNOSIS — D259 Leiomyoma of uterus, unspecified: Secondary | ICD-10-CM | POA: Insufficient documentation

## 2011-09-08 ENCOUNTER — Inpatient Hospital Stay (HOSPITAL_COMMUNITY)
Admission: AD | Admit: 2011-09-08 | Discharge: 2011-09-09 | Disposition: A | Discharge status: Home / Self Care | Payer: Medicaid Other | Source: Ambulatory Visit | Attending: Obstetrics and Gynecology | Admitting: Obstetrics and Gynecology

## 2011-09-08 ENCOUNTER — Encounter (HOSPITAL_COMMUNITY): Payer: Self-pay | Admitting: *Deleted

## 2011-09-08 DIAGNOSIS — O209 Hemorrhage in early pregnancy, unspecified: Secondary | ICD-10-CM | POA: Insufficient documentation

## 2011-09-08 LAB — URINALYSIS, ROUTINE W REFLEX MICROSCOPIC
Glucose, UA: NEGATIVE mg/dL
Ketones, ur: NEGATIVE mg/dL
Protein, ur: NEGATIVE mg/dL

## 2011-09-08 LAB — URINE MICROSCOPIC-ADD ON

## 2011-09-08 NOTE — MAU Note (Signed)
PT  GOES TO  [facility]-  SEEN ON Monday  4-29.   PT SAYS SHE WAS AT WORK TODAY AND HAD BACK PAIN- SO WENT TO B-ROOM AT 915 PM.- BLOOD WAS IN PANTIES AND WHEN SHE WIPED.  IN TRIAGE - NO PAD.    BACK PAIN STARTED AT 6 PM.    DID NOT  TAKE ANY MED FOR PAIN.

## 2011-09-09 LAB — WET PREP, GENITAL
Trich, Wet Prep: NONE SEEN
Yeast Wet Prep HPF POC: NONE SEEN

## 2011-09-09 NOTE — Discharge Instructions (Signed)
Vaginal Bleeding During Pregnancy  A small amount of bleeding from the vagina can happen anytime during pregnancy. Be sure to tell your doctor about all vaginal bleeding.   HOME CARE   Get plenty of rest and sleep.   Count the number of pads you use each day. Do not use tampons.   Save any tissue you pass for your doctor to see.   Do not exercise   Do not do any heavy lifting.   Avoid going up and down stairs. If you must climb stairs, go slowly.   Do not have sex (intercourse) or orgasms until approved by your doctor.   Do not douche.   Only take medicine as told by your doctor. Do not take aspirin.   Eat healthy.   Always keep your follow-up appointments.  GET HELP RIGHT AWAY IF:    You feel the baby moving less or not moving at all.   The bleeding gets worse.   You have very painful cramps or pain in your stomach or back.   You pass large clots or anything that looks like tissue.   You have a temperature by mouth above 102 F (38.9 C).   You feel very weak.   You have chills.   You feel dizzy or pass out (faint).   You have a gush of fluid from the vagina.  MAKE SURE YOU:    Understand these instructions.   Will watch your condition.   Will get help right away if you are not doing well or get worse.  Document Released: 02/02/2008 Document Revised: 04/14/2011 Document Reviewed: 03/31/2009  [employer] Patient Information 2012 [employer].

## 2011-09-09 NOTE — MAU Provider Note (Signed)
Chief Complaint:  Vaginal Bleeding    First Provider Initiated Contact with Patient 09/09/11 0009      Alexis Blake is  26 y.o. G1P0.  Patient's last menstrual period was 06/09/2011.. [redacted]w[redacted]d   She presents complaining of Vaginal Bleeding  Pt presents for evaluation of vaginal spotting. Noted pink/red spotting on tissue after voiding. Denies abd pain, vag discharge, recent intercourse, dysuria. PNC at [facility] in [place].  Obstetrical/Gynecological History: OB History    Grav Para Term Preterm Abortions TAB SAB Ect Mult Living   1               Past Medical History: Past Medical History  Diagnosis Date  . Asthma     Past Surgical History: Past Surgical History  Procedure Date  . No past surgeries     Family History: History reviewed. No pertinent family history.  Social History: History  Substance Use Topics  . Smoking status: Never Smoker   . Smokeless tobacco: Not on file  . Alcohol Use: No    Allergies:  Allergies  Allergen Reactions  . Darvocet (Propoxyphene-Acetaminophen) Anaphylaxis    No prescriptions prior to admission    Review of Systems - Negative except what has been reviewed in HPI  Physical Exam   Blood pressure 135/57, pulse 92, temperature 98.7 F (37.1 C), temperature source Oral, resp. rate 18, height 5\' 2"  (1.575 m), weight 312 lb (141.522 kg), last menstrual period 06/09/2011.  General: General appearance - alert, well appearing, and in no distress, oriented to person, place, and time and morbidly obese, appears comfortable Mental status - alert, oriented to person, place, and time, normal mood, behavior, speech, dress, motor activity, and thought processes, agitated Abdomen - soft, nontender, nondistended, no masses or organomegaly obese Focused Gynecological Exam: VULVA: normal appearing vulva with no masses, tenderness or lesions, VAGINA: vaginal discharge - mucoid and no bleeding or bloody discharge noted, CERVIX: normal appearing  cervix without discharge or lesions, closed, UTERUS: non tender, unable to appreciate size due to habitus ADNEXA: unable to appreciate size due to habitus non tender  Labs: No results found for this or any previous visit (from the past 24 hour(s)). Imaging Studies:  Informal US performed secondary to unable to doppler FHTs. Viable IUP with cardiac activity, c/w date, +FM noted.    Assessment: 1. Bleeding in early pregnancy      Plan: Discharge home Reassurance given of viable IUP FU with OB/Gyn in [place] as scheduled  Desirey Keahey E.

## 2011-09-09 NOTE — MAU Note (Signed)
Katherine Basset CNM at the bedside.  Bedside U/S performed.

## 2011-09-10 LAB — URINE CULTURE: Colony Count: 9000

## 2011-09-19 NOTE — MAU Provider Note (Signed)
Agree with above note.  Alexis Blake 09/19/2011 11:58 AM

## 2012-07-09 ENCOUNTER — Emergency Department (HOSPITAL_BASED_OUTPATIENT_CLINIC_OR_DEPARTMENT_OTHER)
Admission: EM | Admit: 2012-07-09 | Discharge: 2012-07-09 | Disposition: A | Discharge status: Home / Self Care | Payer: No Typology Code available for payment source | Attending: Emergency Medicine | Admitting: Emergency Medicine

## 2012-07-09 ENCOUNTER — Encounter (HOSPITAL_BASED_OUTPATIENT_CLINIC_OR_DEPARTMENT_OTHER): Payer: Self-pay | Admitting: *Deleted

## 2012-07-09 ENCOUNTER — Emergency Department (HOSPITAL_BASED_OUTPATIENT_CLINIC_OR_DEPARTMENT_OTHER): Payer: No Typology Code available for payment source

## 2012-07-09 DIAGNOSIS — Z79899 Other long term (current) drug therapy: Secondary | ICD-10-CM | POA: Insufficient documentation

## 2012-07-09 DIAGNOSIS — S301XXA Contusion of abdominal wall, initial encounter: Secondary | ICD-10-CM | POA: Insufficient documentation

## 2012-07-09 DIAGNOSIS — R0602 Shortness of breath: Secondary | ICD-10-CM | POA: Insufficient documentation

## 2012-07-09 DIAGNOSIS — Z9889 Other specified postprocedural states: Secondary | ICD-10-CM | POA: Insufficient documentation

## 2012-07-09 DIAGNOSIS — S0993XA Unspecified injury of face, initial encounter: Secondary | ICD-10-CM | POA: Insufficient documentation

## 2012-07-09 DIAGNOSIS — S8990XA Unspecified injury of unspecified lower leg, initial encounter: Secondary | ICD-10-CM | POA: Insufficient documentation

## 2012-07-09 DIAGNOSIS — Y9389 Activity, other specified: Secondary | ICD-10-CM | POA: Insufficient documentation

## 2012-07-09 DIAGNOSIS — Y92410 Unspecified street and highway as the place of occurrence of the external cause: Secondary | ICD-10-CM | POA: Insufficient documentation

## 2012-07-09 DIAGNOSIS — R41840 Attention and concentration deficit: Secondary | ICD-10-CM | POA: Insufficient documentation

## 2012-07-09 DIAGNOSIS — J45909 Unspecified asthma, uncomplicated: Secondary | ICD-10-CM | POA: Insufficient documentation

## 2012-07-09 DIAGNOSIS — M25572 Pain in left ankle and joints of left foot: Secondary | ICD-10-CM

## 2012-07-09 DIAGNOSIS — S298XXA Other specified injuries of thorax, initial encounter: Secondary | ICD-10-CM | POA: Insufficient documentation

## 2012-07-09 DIAGNOSIS — IMO0002 Reserved for concepts with insufficient information to code with codable children: Secondary | ICD-10-CM | POA: Insufficient documentation

## 2012-07-09 DIAGNOSIS — R110 Nausea: Secondary | ICD-10-CM | POA: Insufficient documentation

## 2012-07-09 DIAGNOSIS — R109 Unspecified abdominal pain: Secondary | ICD-10-CM | POA: Insufficient documentation

## 2012-07-09 DIAGNOSIS — S060X9A Concussion with loss of consciousness of unspecified duration, initial encounter: Secondary | ICD-10-CM | POA: Insufficient documentation

## 2012-07-09 MED ORDER — TRAMADOL HCL 50 MG PO TABS: 50.0000 mg | ORAL_TABLET | Freq: Four times a day (QID) | ORAL | Status: DC | PRN

## 2012-07-09 MED ORDER — HYDROCODONE-ACETAMINOPHEN 5-325 MG PO TABS
2.0000 | ORAL_TABLET | Freq: Once | ORAL | Status: DC
  Filled 2012-07-09: qty 2

## 2012-07-09 MED ORDER — ALBUTEROL SULFATE (5 MG/ML) 0.5% IN NEBU
2.5000 mg | INHALATION_SOLUTION | Freq: Once | RESPIRATORY_TRACT | Status: AC
  Administered 2012-07-09: 2.5 mg via RESPIRATORY_TRACT
  Filled 2012-07-09: qty 0.5

## 2012-07-09 MED ORDER — TRAMADOL HCL 50 MG PO TABS
50.0000 mg | ORAL_TABLET | Freq: Once | ORAL | Status: AC
  Administered 2012-07-09: 50 mg via ORAL
  Filled 2012-07-09: qty 1
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
## 2012-07-09 NOTE — ED Notes (Signed)
Family at bedside. 

## 2012-07-09 NOTE — ED Notes (Signed)
MD at bedside. 
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
## 2012-07-09 NOTE — ED Notes (Signed)
Pt. Did not want vicodin, she asked for tramadol instead.

## 2012-07-09 NOTE — ED Notes (Addendum)
Pt. Resting with eyes closed at time of pain re assessment

## 2012-07-09 NOTE — ED Notes (Signed)
HHN treatment given,  BBS clear but distant with POOR patient effort,  RR 12 HR 81, SpO2 100% on room air, patient with no respiratory distress noted at this time.

## 2012-07-10 NOTE — ED Provider Notes (Signed)
  I performed a history and physical examination of Alexis Blake and discussed her management with Johnnette Gourd  I agree with the history, physical, assessment, and plan of care, with the following exceptions: None  I was present for the following procedures: None Time Spent in Critical Care of the patient: None Time spent in discussions with the patient and family: 10  RAY,DANIELLE Corlis Leak, MD 07/10/12 [phone]

## 2012-09-13 ENCOUNTER — Encounter (HOSPITAL_BASED_OUTPATIENT_CLINIC_OR_DEPARTMENT_OTHER): Payer: Self-pay | Admitting: *Deleted

## 2012-09-13 ENCOUNTER — Emergency Department (HOSPITAL_BASED_OUTPATIENT_CLINIC_OR_DEPARTMENT_OTHER)
Admission: EM | Admit: 2012-09-13 | Discharge: 2012-09-13 | Disposition: A | Discharge status: Home / Self Care | Payer: No Typology Code available for payment source | Attending: Emergency Medicine | Admitting: Emergency Medicine

## 2012-09-13 DIAGNOSIS — J45909 Unspecified asthma, uncomplicated: Secondary | ICD-10-CM | POA: Insufficient documentation

## 2012-09-13 DIAGNOSIS — Z79899 Other long term (current) drug therapy: Secondary | ICD-10-CM | POA: Insufficient documentation

## 2012-09-13 DIAGNOSIS — Z3202 Encounter for pregnancy test, result negative: Secondary | ICD-10-CM | POA: Insufficient documentation

## 2012-09-13 DIAGNOSIS — A599 Trichomoniasis, unspecified: Secondary | ICD-10-CM

## 2012-09-13 DIAGNOSIS — N949 Unspecified condition associated with female genital organs and menstrual cycle: Secondary | ICD-10-CM | POA: Insufficient documentation

## 2012-09-13 LAB — URINE MICROSCOPIC-ADD ON

## 2012-09-13 LAB — WET PREP, GENITAL

## 2012-09-13 LAB — URINALYSIS, ROUTINE W REFLEX MICROSCOPIC
Glucose, UA: NEGATIVE mg/dL
Nitrite: NEGATIVE
Protein, ur: NEGATIVE mg/dL
Urobilinogen, UA: 0.2 mg/dL (ref 0.0–1.0)

## 2012-09-13 LAB — PREGNANCY, URINE: Preg Test, Ur: NEGATIVE

## 2012-09-13 MED ORDER — PREDNISONE 10 MG PO TABS: 60.0000 mg | ORAL_TABLET | Freq: Every day | ORAL | Status: DC

## 2012-09-13 MED ORDER — DOXYCYCLINE HYCLATE 100 MG PO CAPS: 100.0000 mg | ORAL_CAPSULE | Freq: Two times a day (BID) | ORAL | Status: DC

## 2012-09-13 MED ORDER — METRONIDAZOLE 500 MG PO TABS: 500.0000 mg | ORAL_TABLET | Freq: Two times a day (BID) | ORAL | Status: DC

## 2012-09-13 MED ORDER — OXYCODONE-ACETAMINOPHEN 5-325 MG PO TABS: 2.0000 | ORAL_TABLET | ORAL | Status: DC | PRN

## 2012-09-13 MED ORDER — CEFTRIAXONE SODIUM 250 MG IJ SOLR
250.0000 mg | INTRAMUSCULAR | Status: DC
  Administered 2012-09-13: 250 mg via INTRAMUSCULAR
  Filled 2012-09-13: qty 250

## 2012-09-13 NOTE — ED Provider Notes (Signed)
History     CSN: [id number]  Arrival date & time 09/13/12  2021   First MD Initiated Contact with Patient 09/13/12 2043      Chief Complaint  Patient presents with  . Back Pain    (Consider location/radiation/quality/duration/timing/severity/associated sxs/prior treatment) HPI Comments: Patient presents with a two-day history of lower back pain. She also has some burning in her vaginal area when she wipes. She denies any burning on urination. She denies any nausea vomiting. She denies any new vaginal discharge. She denies abdominal pain. Chest denies he fevers or chills. She's had a constant throbbing pain to her lower back this been worsening over last 2 days. She does have a history of both urinary and vaginal infections.  Patient is a 27 y.o. female presenting with back pain.  Back Pain Associated symptoms: no abdominal pain, no chest pain, no fever, no headaches, no numbness and no weakness     Past Medical History  Diagnosis Date  . Asthma     Past Surgical History  Procedure Laterality Date  . Cesarean section      No family history on file.  History  Substance Use Topics  . Smoking status: Never Smoker   . Smokeless tobacco: Never Used  . Alcohol Use: 8.4 oz/week    14 Glasses of wine per week    OB History   Grav Para Term Preterm Abortions TAB SAB Ect Mult Living   1               Review of Systems  Constitutional: Negative for fever, chills, diaphoresis and fatigue.  HENT: Negative for congestion, rhinorrhea and sneezing.   Eyes: Negative.   Respiratory: Negative for cough, chest tightness and shortness of breath.   Cardiovascular: Negative for chest pain and leg swelling.  Gastrointestinal: Negative for nausea, vomiting, abdominal pain, diarrhea and blood in stool.  Genitourinary: Positive for vaginal pain. Negative for frequency, hematuria, flank pain, vaginal bleeding, vaginal discharge and difficulty urinating.  Musculoskeletal: Positive for back  pain. Negative for arthralgias.  Skin: Negative for rash.  Neurological: Negative for dizziness, speech difficulty, weakness, numbness and headaches.    Allergies  Darvocet  Home Medications   Current Outpatient Rx  Name  Route  Sig  Dispense  Refill  . acetaminophen (TYLENOL) 500 MG tablet   Oral   Take 1,000 mg by mouth every 6 (six) hours as needed. For pain          . albuterol (PROVENTIL HFA;VENTOLIN HFA) 108 (90 BASE) MCG/ACT inhaler   Inhalation   Inhale 1-2 puffs into the lungs every 6 (six) hours as needed for wheezing.   1 Inhaler   0   . traMADol (ULTRAM) 50 MG tablet   Oral   Take 1 tablet (50 mg total) by mouth every 6 (six) hours as needed for pain.   15 tablet   0   . doxycycline (VIBRAMYCIN) 100 MG capsule   Oral   Take 1 capsule (100 mg total) by mouth 2 (two) times daily.   20 capsule   0   . Hydrocodone-Acetaminophen (LORCET-HD PO)   Oral   Take by mouth.         . metroNIDAZOLE (FLAGYL) 500 MG tablet   Oral   Take 1 tablet (500 mg total) by mouth 2 (two) times daily. One po bid x 7 days   14 tablet   0   . Prenatal Vit-Fe Fumarate-FA (PRENATAL MULTIVITAMIN) TABS   Oral  Take 1 tablet by mouth daily.           BP 133/73  Pulse 88  Temp(Src) 98.5 F (36.9 C) (Oral)  Resp 20  Ht 5\' 2"  (1.575 m)  Wt 250 lb (113.399 kg)  BMI 45.71 kg/m2  SpO2 98%  Physical Exam  Constitutional: She is oriented to person, place, and time. She appears well-developed and well-nourished.  Obese  HENT:  Head: Normocephalic and atraumatic.  Eyes: Pupils are equal, round, and reactive to light.  Neck: Normal range of motion. Neck supple.  Cardiovascular: Normal rate, regular rhythm and normal heart sounds.   Pulmonary/Chest: Effort normal and breath sounds normal. No respiratory distress. She has no wheezes. She has no rales. She exhibits no tenderness.  Abdominal: Soft. Bowel sounds are normal. There is no tenderness. There is no rebound and no  guarding.  No CVA tenderness  Genitourinary:  Positive large amount of thick white yellow discharge. There is positive cervical motion tenderness but no adnexal tenderness.  Musculoskeletal: Normal range of motion. She exhibits no edema.  Lymphadenopathy:    She has no cervical adenopathy.  Neurological: She is alert and oriented to person, place, and time.  Skin: Skin is warm and dry. No rash noted.  Psychiatric: She has a normal mood and affect.    ED Course  Procedures (including critical care time)  Results for orders placed during the hospital encounter of 09/13/12  WET PREP, GENITAL      Result Value Range   Yeast Wet Prep HPF POC NONE SEEN  NONE SEEN   Trich, Wet Prep MODERATE (*) NONE SEEN   Clue Cells Wet Prep HPF POC FEW (*) NONE SEEN   WBC, Wet Prep HPF POC TOO NUMEROUS TO COUNT (*) NONE SEEN  PREGNANCY, URINE      Result Value Range   Preg Test, Ur NEGATIVE  NEGATIVE  URINALYSIS, ROUTINE W REFLEX MICROSCOPIC      Result Value Range   Color, Urine YELLOW  YELLOW   APPearance CLOUDY (*) CLEAR   Specific Gravity, Urine 1.016  1.005 - 1.030   pH 6.0  5.0 - 8.0   Glucose, UA NEGATIVE  NEGATIVE mg/dL   Hgb urine dipstick SMALL (*) NEGATIVE   Bilirubin Urine NEGATIVE  NEGATIVE   Ketones, ur NEGATIVE  NEGATIVE mg/dL   Protein, ur NEGATIVE  NEGATIVE mg/dL   Urobilinogen, UA 0.2  0.0 - 1.0 mg/dL   Nitrite NEGATIVE  NEGATIVE   Leukocytes, UA LARGE (*) NEGATIVE  URINE MICROSCOPIC-ADD ON      Result Value Range   Squamous Epithelial / LPF FEW (*) RARE   WBC, UA 21-50  <3 WBC/hpf   RBC / HPF 3-6  <3 RBC/hpf   Bacteria, UA RARE  RARE   Urine-Other TRICHOMONAS PRESENT     No results found.    1. Trichomonas       MDM  Patient was treated for Trichomonas as well as possible PID with Rocephin and doxycycline.        Rolan Bucco, MD 09/13/12 2153

## 2012-09-13 NOTE — ED Notes (Signed)
Back pain, frequency, dysuria x 2 days

## 2012-09-14 LAB — GC/CHLAMYDIA PROBE AMP: GC Probe RNA: NEGATIVE

## 2012-10-10 IMAGING — US US OB TRANSVAGINAL
1 series · 13 of 28 positions shown · non-contrast
Comparison: Pelvic ultrasound 08/26/2009

CLINICAL DATA: Pregnant.  Estimated gestational age by LMP is 6
weeks 5 days.

OBSTETRIC <14 WK US AND TRANSVAGINAL OB US
TECHNIQUE: Both transabdominal and transvaginal ultrasound
examinations were performed for complete evaluation of the
gestation as well as the maternal uterus, adnexal regions, and
pelvic cul-de-sac.  Transvaginal technique was performed to assess
early pregnancy.

[Series 1: us ob transvaginal · 0.35mm/px · 13 of 49 slices shown]
[im 2/49]
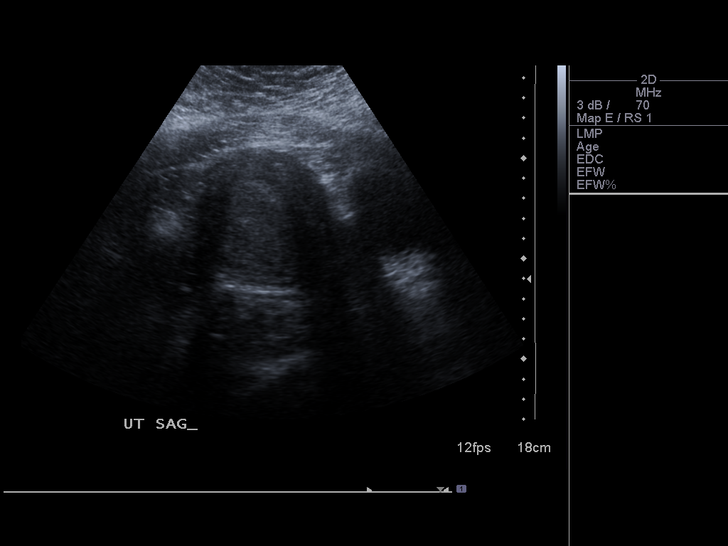
[im 6/49]
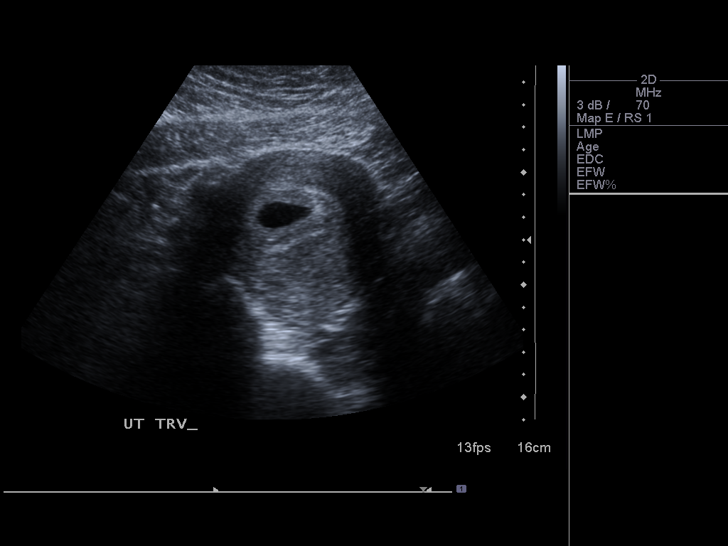
[im 9/49]
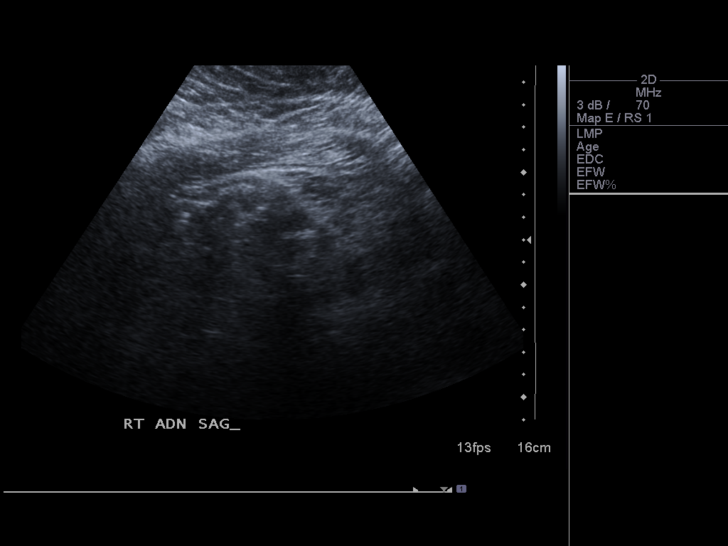
[im 13/49]
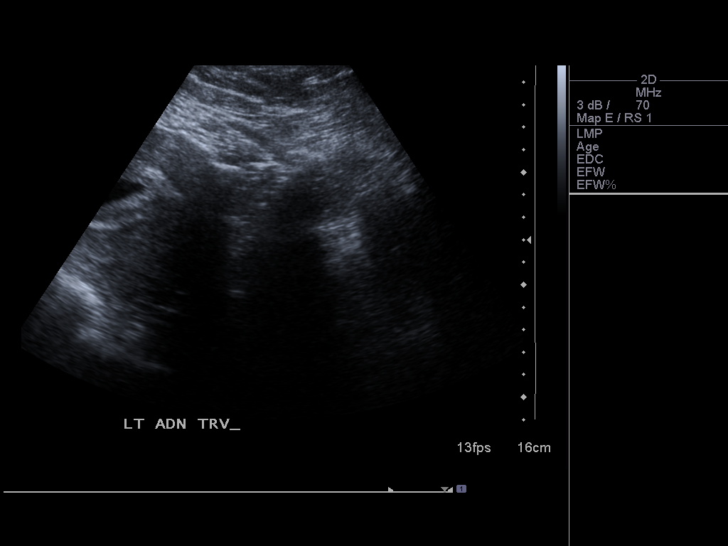
[im 17/49]
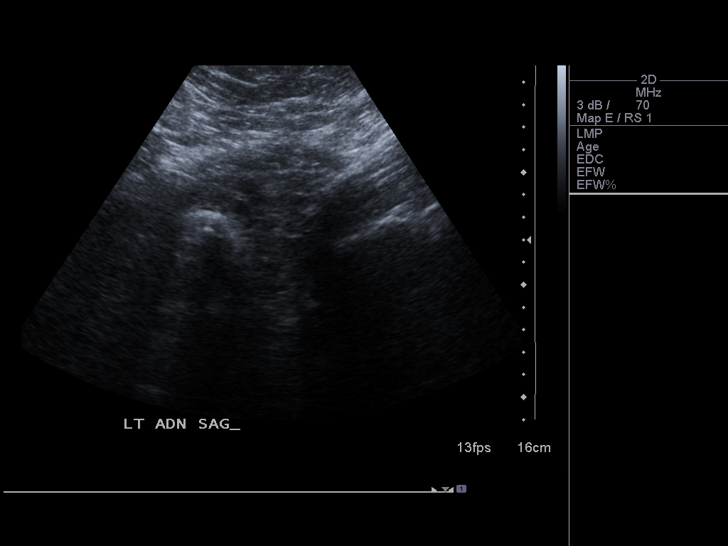
[im 20/49]
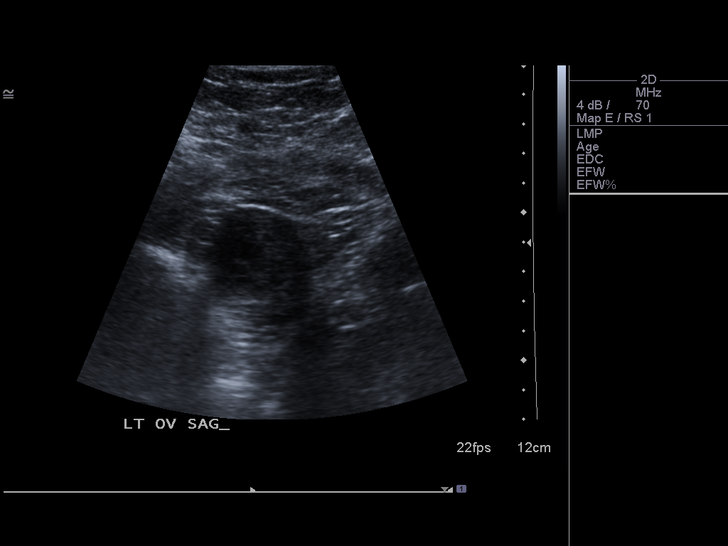
[im 25/49]
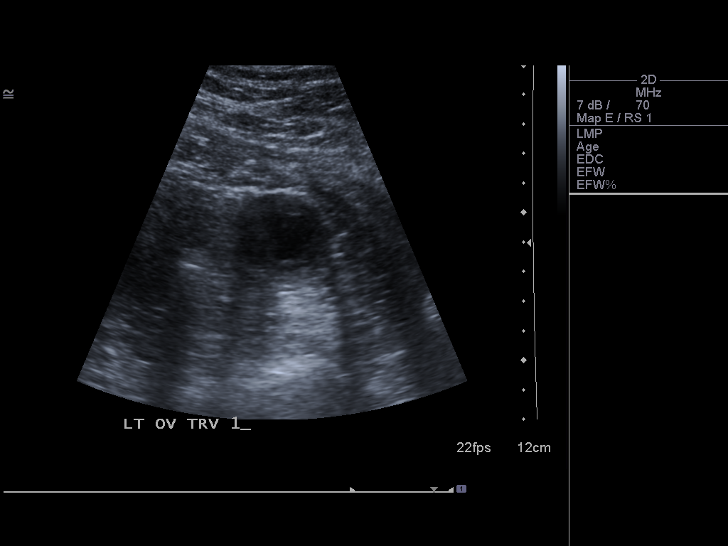
[im 29/49]
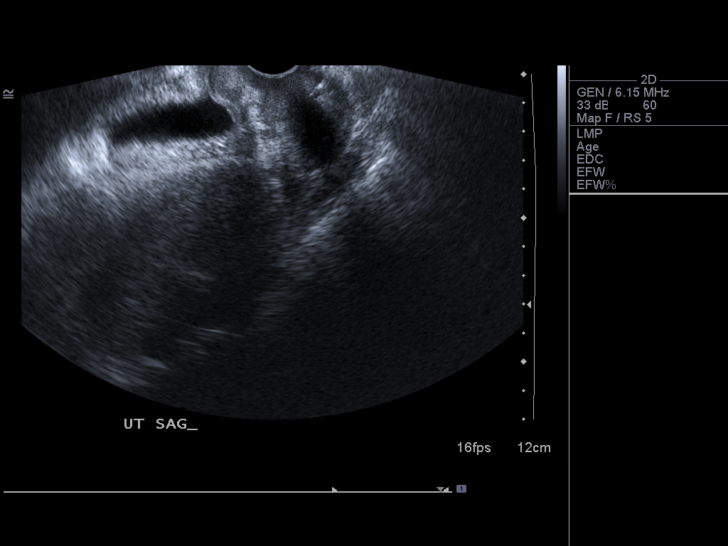
[im 33/49]
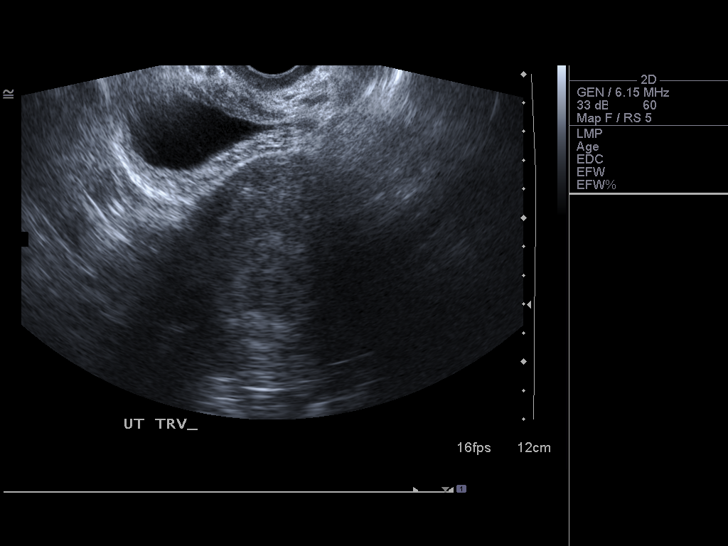
[im 36/49]
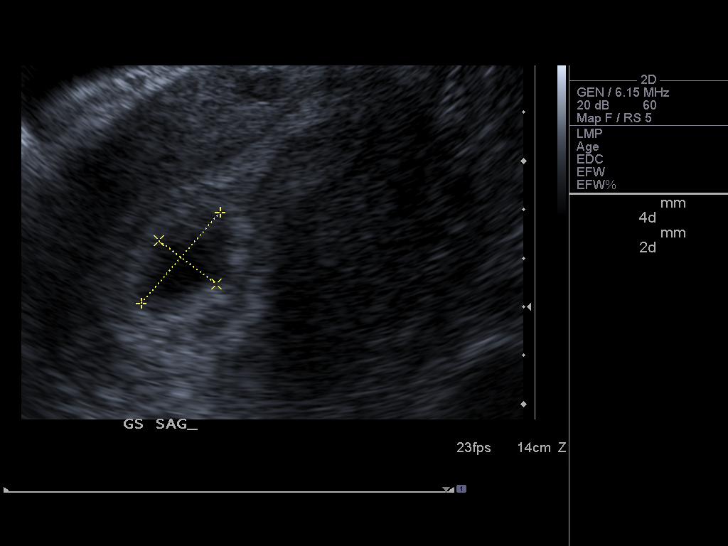
[im 40/49]
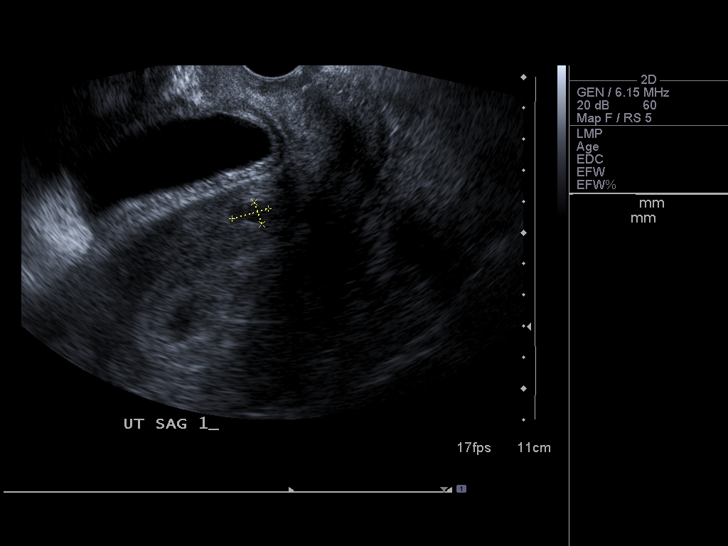
[im 43/49]
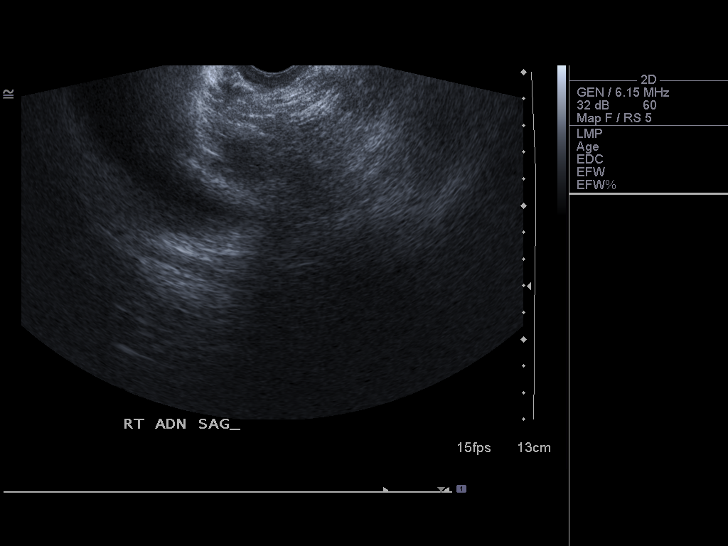
[im 47/49]
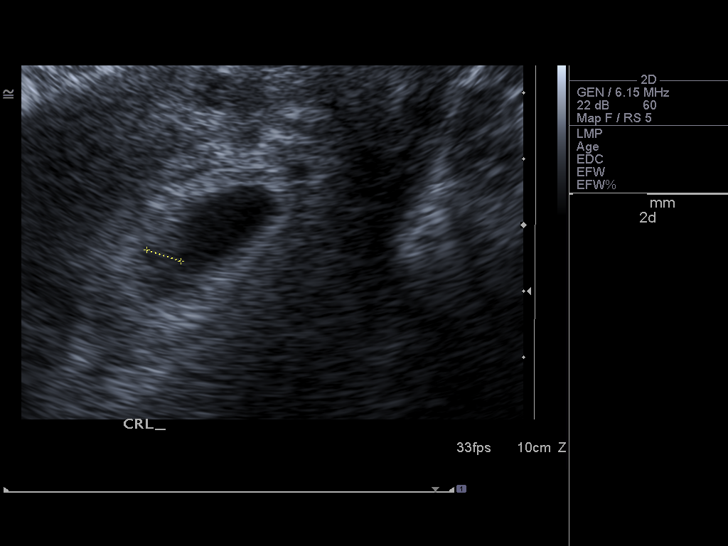

[13 of 28 positions shown; findings below may reference images not displayed]

Intrauterine gestational sac:  Visualized
Yolk sac: Visualized
Embryo: Visualized
Cardiac Activity: Visualized on real-time ultrasound
Heart Rate: 121 bpm

MSD: 18.3 mm 6w 5d
CRL: 5.6mm  6 w 2d            US EDC: 03/16/2012

Maternal uterus/adnexae:
Right ovary is not visualized.  The left ovary measures 3.3 x 3.0 x
3.1 cm and contains a probable 1.6 cm corpus luteum.  No adnexal
mass is identified.

A 1.2 x 1.0 x 0.8 cm hypoechoic area in the anterior uterine body
likely reflects a small you intramural fibroid.

No evidence of subchorionic hemorrhage.  No free pelvic fluid
identified.
IMPRESSION: 1.  Single living intrauterine gestation.  Estimated gestational
age by crown-rump length of 6 weeks 2 days correlates well with
dating by LMP of 6 weeks 5 days.
2.  Left ovary within normal limits.  Nonvisualization of the right
ovary.
3.  Small 1.2 cm intramural uterine fibroid.

Ultrasound for fetal anatomic evaluation at 18 to 19 weeks
gestational age is recommended.

## 2013-05-01 ENCOUNTER — Emergency Department (HOSPITAL_BASED_OUTPATIENT_CLINIC_OR_DEPARTMENT_OTHER)
Admission: EM | Admit: 2013-05-01 | Discharge: 2013-05-01 | Disposition: A | Discharge status: Home / Self Care | Payer: BC Managed Care – PPO | Attending: Emergency Medicine | Admitting: Emergency Medicine

## 2013-05-01 ENCOUNTER — Encounter (HOSPITAL_BASED_OUTPATIENT_CLINIC_OR_DEPARTMENT_OTHER): Payer: Self-pay | Admitting: Emergency Medicine

## 2013-05-01 DIAGNOSIS — J020 Streptococcal pharyngitis: Secondary | ICD-10-CM

## 2013-05-01 DIAGNOSIS — E669 Obesity, unspecified: Secondary | ICD-10-CM | POA: Insufficient documentation

## 2013-05-01 DIAGNOSIS — J45909 Unspecified asthma, uncomplicated: Secondary | ICD-10-CM | POA: Insufficient documentation

## 2013-05-01 LAB — RAPID STREP SCREEN (MED CTR MEBANE ONLY): Streptococcus, Group A Screen (Direct): POSITIVE — AB

## 2013-05-01 MED ORDER — HYDROCODONE-ACETAMINOPHEN 7.5-325 MG/15ML PO SOLN
10.0000 mL | Freq: Once | ORAL | Status: AC
  Administered 2013-05-01: 10 mL via ORAL
  Filled 2013-05-01: qty 15

## 2013-05-01 MED ORDER — DEXAMETHASONE 4 MG PO TABS: 10.0000 mg | ORAL_TABLET | Freq: Once | ORAL | Status: AC

## 2013-05-01 MED ORDER — PENICILLIN G BENZATHINE 1200000 UNIT/2ML IM SUSP
1.2000 10*6.[IU] | Freq: Once | INTRAMUSCULAR | Status: AC
  Administered 2013-05-01: 1.2 10*6.[IU] via INTRAMUSCULAR
  Filled 2013-05-01: qty 2

## 2013-05-01 MED ORDER — DEXAMETHASONE 4 MG PO TABS
ORAL_TABLET | ORAL | Status: AC
  Administered 2013-05-01: 10 mg via ORAL
  Filled 2013-05-01: qty 3

## 2013-05-01 MED ORDER — HYDROCODONE-ACETAMINOPHEN 7.5-325 MG/15ML PO SOLN: 15.0000 mL | Freq: Four times a day (QID) | ORAL | Status: DC | PRN

## 2013-05-01 NOTE — ED Notes (Addendum)
Sore throat since last week.  Some fever last week.  Pt states her son had strep throat recently.

## 2013-05-02 NOTE — ED Provider Notes (Signed)
CSN: [id number]     Arrival date & time 05/01/13  1442 History   First MD Initiated Contact with Patient 05/01/13 1503     Chief Complaint  Patient presents with  . Sore Throat   (Consider location/radiation/quality/duration/timing/severity/associated sxs/prior Treatment) HPI  This is a 27 year old female who presents with one week of sore throat. Patient also reports fevers to 103 and decreased by mouth intake. She reports cough without shortness of breath or chest pain. Patient reports that she has a son diagnosed with strep throat last week. Patient states "it feels like pins and needles when i swallow."  Patient denies any chest pain, nausea, vomiting, or diarrhea.    Past Medical History  Diagnosis Date  . Asthma    Past Surgical History  Procedure Laterality Date  . Cesarean section     No family history on file. History  Substance Use Topics  . Smoking status: Never Smoker   . Smokeless tobacco: Never Used  . Alcohol Use: 8.4 oz/week    14 Glasses of wine per week   OB History   Grav Para Term Preterm Abortions TAB SAB Ect Mult Living   1              Review of Systems  Constitutional: Positive for fever.  HENT: Positive for congestion and sore throat.   Respiratory: Positive for cough. Negative for chest tightness and shortness of breath.   Cardiovascular: Negative for chest pain.  Gastrointestinal: Negative for nausea, vomiting and abdominal pain.  Genitourinary: Negative for dysuria.  Skin: Negative for rash.  Neurological: Negative for headaches.  All other systems reviewed and are negative.    Allergies  Darvocet  Home Medications   Current Outpatient Rx  Name  Route  Sig  Dispense  Refill  . HYDROcodone-acetaminophen (HYCET) 7.5-325 mg/15 ml solution   Oral   Take 15 mLs by mouth every 6 (six) hours as needed for moderate pain.   120 mL   0    BP 162/109  Pulse 102  Temp(Src) 98.5 F (36.9 C) (Oral)  Resp 18  Ht 5\' 2"  (1.575 m)  Wt 250  lb (113.399 kg)  BMI 45.71 kg/m2  SpO2 100% Physical Exam  Nursing note and vitals reviewed. Constitutional: She is oriented to person, place, and time. No distress.  Obese  HENT:  Head: Normocephalic and atraumatic.  Exudate noted on the bilateral tonsils, no trismus, no asymmetric tonsillar swelling or uvular deviation noted  Eyes: Pupils are equal, round, and reactive to light.  Neck: Neck supple.  Cardiovascular: Normal rate, regular rhythm and normal heart sounds.   No murmur heard. Pulmonary/Chest: Effort normal. No respiratory distress.  Abdominal: Soft. There is no tenderness.  Lymphadenopathy:    She has cervical adenopathy.  Neurological: She is alert and oriented to person, place, and time.  Skin: Skin is warm and dry.  Psychiatric: She has a normal mood and affect.    ED Course  Procedures (including critical care time) Labs Review Labs Reviewed  RAPID STREP SCREEN - Abnormal; Notable for the following:    Streptococcus, Group A Screen (Direct) POSITIVE (*)    All other components within normal limits   Imaging Review No results found.  EKG Interpretation   None       MDM   1. Strep pharyngitis    Patient presents with sore throat. She is nontoxic-appearing on exam. She is Centor three out of four positive for strep pharyngitis. Strep screen was sent  and patient was empirically treated with Bicillin, Decadron, and Lortab elixir for comfort. Strep screen is positive.  Patient was notified of result.  Patient reports improvement of symptoms with pain control and steroids. Patient given strict return precautions.  After history, exam, and medical workup I feel the patient has been appropriately medically screened and is safe for discharge home. Pertinent diagnoses were discussed with the patient. Patient was given return precautions.       Shon Baton, MD 05/02/13 [phone]

## 2013-09-27 ENCOUNTER — Emergency Department (HOSPITAL_BASED_OUTPATIENT_CLINIC_OR_DEPARTMENT_OTHER)
Admission: EM | Admit: 2013-09-27 | Discharge: 2013-09-27 | Disposition: A | Discharge status: Home / Self Care | Payer: BC Managed Care – PPO | Attending: Emergency Medicine | Admitting: Emergency Medicine

## 2013-09-27 DIAGNOSIS — H1130 Conjunctival hemorrhage, unspecified eye: Secondary | ICD-10-CM

## 2013-09-27 DIAGNOSIS — J45909 Unspecified asthma, uncomplicated: Secondary | ICD-10-CM | POA: Insufficient documentation

## 2013-09-27 MED ORDER — TOBRAMYCIN 0.3 % OP SOLN
2.0000 [drp] | Freq: Four times a day (QID) | OPHTHALMIC | Status: DC
  Administered 2013-09-27: 2 [drp] via OPHTHALMIC
  Filled 2013-09-27: qty 5

## 2013-09-27 NOTE — Discharge Instructions (Signed)
Subconjunctival Hemorrhage °A subconjunctival hemorrhage is a bright red patch covering a portion of the white of the eye. The white part of the eye is called the sclera, and it is covered by a thin membrane called the conjunctiva. This membrane is clear, except for tiny blood vessels that you can see with the naked eye. When your eye is irritated or inflamed and becomes red, it is because the vessels in the conjunctiva are swollen. °Sometimes, a blood vessel in the conjunctiva can break and bleed. When this occurs, the blood builds up between the conjunctiva and the sclera, and spreads out to create a red area. The red spot may be very small at first. It may then spread to cover a larger part of the surface of the eye, or even all of the visible white part of the eye. °In almost all cases, the blood will go away and the eye will become white again. Before completely dissolving, however, the red area may spread. It may also become brownish-yellow in color, before going away. If a lot of blood collects under the conjunctiva, it may look like a bulge on the surface of the eye. This looks scary, but it will also eventually flatten out and go away. Subconjunctival hemorrhages do not cause pain, but if swollen, may cause a feeling of irritation. There is no effect on vision.  °CAUSES  °· The most common cause is mild trauma (rubbing the eye, irritation). °· Subconjunctival hemorrhages can happen because of coughing or straining (lifting heavy objects), vomiting, or sneezing. °· In some cases, your doctor may want to check your blood pressure. High blood pressure can also cause a sunconjunctival hemorrhage. °· Severe trauma or blunt injuries. °· Diseases that affect blood clotting (hemophilia, leukemia). °· Abnormalities of blood vessels behind the eye (carotid cavernous sinus fistula). °· Tumors behind the eye. °· Certain drugs (aspirin, coumadin, heparin). °· Recent eye surgery. °HOME CARE INSTRUCTIONS  °· Do not worry  about the appearance of your eye. You may continue your usual activities. °· Often, follow-up is not necessary. °SEEK MEDICAL CARE IF:  °· Your eye becomes painful. °· The bleeding does not disappear within 3 weeks. °· Bleeding occurs elsewhere, for example, under the skin, in the mouth, or in the other eye. °· You have recurring subconjunctival hemorrhages. °SEEK IMMEDIATE MEDICAL CARE IF:  °· Your vision changes or you have difficulty seeing. °· You develop severe headache, persistent vomiting, confusion, or abnormal drowsiness (lethargy). °· Your eye seems to bulge or protrude from the eye socket. °· You notice the sudden appearance of bruises, or have spontaneous bleeding elsewhere on your body. °Document Released: 04/25/2005 Document Revised: 07/18/2011 Document Reviewed: 03/23/2009 °[employer]® Patient Information ©2014 [employer]. ° °

## 2013-09-27 NOTE — ED Provider Notes (Signed)
CSN: [id number]     Arrival date & time 09/27/13  1739 History   First MD Initiated Contact with Patient 09/27/13 1745     Chief Complaint  Patient presents with  . Eye Pain     (Consider location/radiation/quality/duration/timing/severity/associated sxs/prior Treatment) Patient is a 28 y.o. female presenting with eye pain. The history is provided by the patient. No language interpreter was used.  Eye Pain This is a new problem. Pertinent negatives include no congestion, fever or headaches. Associated symptoms comments: She complains of eye and facial discomfort without known trauma. She feels she has swelling underneath upper eye lid and there is bleeding into the sclera of the eye. No visual disturbance. .    Past Medical History  Diagnosis Date  . Asthma    Past Surgical History  Procedure Laterality Date  . Cesarean section     No family history on file. History  Substance Use Topics  . Smoking status: Never Smoker   . Smokeless tobacco: Never Used  . Alcohol Use: 8.4 oz/week    14 Glasses of wine per week   OB History   Grav Para Term Preterm Abortions TAB SAB Ect Mult Living   1              Review of Systems  Constitutional: Negative for fever.  HENT: Negative for congestion and facial swelling.   Eyes: Positive for pain and redness. Negative for visual disturbance.  Neurological: Negative for headaches.      Allergies  Darvocet  Home Medications   Prior to Admission medications   Medication Sig Start Date End Date Taking? Authorizing Provider  HYDROcodone-acetaminophen (HYCET) 7.5-325 mg/15 ml solution Take 15 mLs by mouth every 6 (six) hours as needed for moderate pain. 05/01/13   Merryl Hacker, MD   Pulse 101  Temp(Src) 99.5 F (37.5 C) (Oral)  Resp 16  Wt 250 lb (113.399 kg)  SpO2 99% Physical Exam  Constitutional: She is oriented to person, place, and time. She appears well-developed and well-nourished. No distress.  Eyes: Pupils are  equal, round, and reactive to light.  No hyphema. Small medial subconjunctival hemorrhage present. No lid swelling, hordeolum, or redness.   Neurological: She is alert and oriented to person, place, and time.    ED Course  Procedures (including critical care time) Labs Review Labs Reviewed - No data to display  Imaging Review No results found.   EKG Interpretation None      MDM   Final diagnoses:  None    1. Subconjunctival hemorrhage  Superficial condition without concern for underlying eye pathology. Uncomplicated hemorrhage with mildly erythematous conjunctiva otherwise.     Dewaine Oats, PA-C 10/01/13 [phone]

## 2013-10-03 NOTE — ED Provider Notes (Signed)
Medical screening examination/treatment/procedure(s) were performed by non-physician practitioner and as supervising physician I was immediately available for consultation/collaboration.   EKG Interpretation None        Malvin Johns, MD 10/03/13 1057

## 2014-03-10 ENCOUNTER — Encounter (HOSPITAL_BASED_OUTPATIENT_CLINIC_OR_DEPARTMENT_OTHER): Payer: Self-pay | Admitting: Emergency Medicine

## 2014-07-30 ENCOUNTER — Inpatient Hospital Stay (HOSPITAL_COMMUNITY)
Admission: AD | Admit: 2014-07-30 | Discharge: 2014-07-30 | Disposition: A | Discharge status: Home / Self Care | Payer: Medicaid Other | Source: Ambulatory Visit | Attending: Obstetrics and Gynecology | Admitting: Obstetrics and Gynecology

## 2014-07-30 ENCOUNTER — Encounter (HOSPITAL_COMMUNITY): Payer: Self-pay | Admitting: *Deleted

## 2014-07-30 DIAGNOSIS — O3421 Maternal care for scar from previous cesarean delivery: Secondary | ICD-10-CM | POA: Insufficient documentation

## 2014-07-30 DIAGNOSIS — O9989 Other specified diseases and conditions complicating pregnancy, childbirth and the puerperium: Secondary | ICD-10-CM | POA: Insufficient documentation

## 2014-07-30 DIAGNOSIS — R51 Headache: Secondary | ICD-10-CM | POA: Insufficient documentation

## 2014-07-30 DIAGNOSIS — O1203 Gestational edema, third trimester: Secondary | ICD-10-CM | POA: Insufficient documentation

## 2014-07-30 DIAGNOSIS — O368130 Decreased fetal movements, third trimester, not applicable or unspecified: Secondary | ICD-10-CM | POA: Diagnosis not present

## 2014-07-30 DIAGNOSIS — Z3A37 37 weeks gestation of pregnancy: Secondary | ICD-10-CM | POA: Diagnosis not present

## 2014-07-30 LAB — CBC WITH DIFFERENTIAL/PLATELET
BASOS ABS: 0 10*3/uL (ref 0.0–0.1)
Basophils Relative: 0 % (ref 0–1)
EOS ABS: 0.2 10*3/uL (ref 0.0–0.7)
EOS PCT: 2 % (ref 0–5)
HCT: 31.4 % — ABNORMAL LOW (ref 36.0–46.0)
Hemoglobin: 10.9 g/dL — ABNORMAL LOW (ref 12.0–15.0)
LYMPHS PCT: 25 % (ref 12–46)
Lymphs Abs: 1.7 10*3/uL (ref 0.7–4.0)
MCH: 29.6 pg (ref 26.0–34.0)
MCHC: 34.7 g/dL (ref 30.0–36.0)
MCV: 85.3 fL (ref 78.0–100.0)
Monocytes Absolute: 0.4 10*3/uL (ref 0.1–1.0)
Monocytes Relative: 6 % (ref 3–12)
Neutro Abs: 4.5 10*3/uL (ref 1.7–7.7)
Neutrophils Relative %: 67 % (ref 43–77)
PLATELETS: 247 10*3/uL (ref 150–400)
RBC: 3.68 MIL/uL — ABNORMAL LOW (ref 3.87–5.11)
RDW: 14.2 % (ref 11.5–15.5)
WBC: 6.7 10*3/uL (ref 4.0–10.5)

## 2014-07-30 LAB — COMPREHENSIVE METABOLIC PANEL
ALBUMIN: 2.7 g/dL — AB (ref 3.5–5.2)
ALT: 17 U/L (ref 0–35)
ANION GAP: 8 (ref 5–15)
AST: 17 U/L (ref 0–37)
Alkaline Phosphatase: 81 U/L (ref 39–117)
BUN: 7 mg/dL (ref 6–23)
CALCIUM: 8.3 mg/dL — AB (ref 8.4–10.5)
CO2: 21 mmol/L (ref 19–32)
CREATININE: 0.39 mg/dL — AB (ref 0.50–1.10)
Chloride: 107 mmol/L (ref 96–112)
GFR calc Af Amer: >90 mL/min (ref >90)
Glucose, Bld: 129 mg/dL — ABNORMAL HIGH (ref 70–99)
Potassium: 2.9 mmol/L — ABNORMAL LOW (ref 3.5–5.1)
Sodium: 136 mmol/L (ref 135–145)
Total Bilirubin: 0.5 mg/dL (ref 0.3–1.2)
Total Protein: 6.2 g/dL (ref 6.0–8.3)

## 2014-07-30 LAB — PROTEIN / CREATININE RATIO, URINE
Creatinine, Urine: 103 mg/dL
PROTEIN CREATININE RATIO: 0.09 (ref 0.00–0.15)
TOTAL PROTEIN, URINE: 9 mg/dL

## 2014-07-30 NOTE — MAU Note (Signed)
Pt states she had not felt baby move since early  Tuesday morning.

## 2014-07-30 NOTE — MAU Note (Signed)
Urine in lab 

## 2014-07-30 NOTE — MAU Note (Signed)
No movement since last night.  Has tried all sorts of things, cold fluids, sweetened beverage, food.

## 2014-07-30 NOTE — MAU Provider Note (Signed)
History     CSN: [id number]  Arrival date and time: 07/30/14 1726   First Provider Initiated Contact with Patient 07/30/14 1823      Chief Complaint  Patient presents with  . Decreased Fetal Movement   HPI  Patient is 29 y.o. G2P0 [redacted]w[redacted]d, [facility] patient, here with complaints of decreased fetal movement since last night.  She states that despite trying changes in positions, drinking cold water, she did not feel baby move and still has not felt baby move.  Associated symptoms of severe headache yesterday which has since resolved, LE edema x 2 weeks, no vision changes.  She denies any previous history of high blood pressure.   +FM, denies LOF, VB, contractions, vaginal discharge.  Past Medical History  Diagnosis Date  . Asthma     Past Surgical History  Procedure Laterality Date  . Cesarean section      Family History  Problem Relation Age of Onset  . Alcohol abuse Neg Hx   . Arthritis Neg Hx   . Asthma Neg Hx   . Birth defects Neg Hx   . Cancer Neg Hx   . COPD Neg Hx   . Depression Neg Hx   . Diabetes Neg Hx   . Drug abuse Neg Hx   . Early death Neg Hx   . Hearing loss Neg Hx   . Heart disease Neg Hx   . Hyperlipidemia Neg Hx   . Hypertension Neg Hx   . Kidney disease Neg Hx   . Learning disabilities Neg Hx   . Mental illness Neg Hx   . Mental retardation Neg Hx   . Miscarriages / Stillbirths Neg Hx   . Stroke Neg Hx   . Vision loss Neg Hx   . Varicose Veins Neg Hx     History  Substance Use Topics  . Smoking status: Never Smoker   . Smokeless tobacco: Never Used  . Alcohol Use: 8.4 oz/week    14 Glasses of wine per week    Allergies:  Allergies  Allergen Reactions  . Darvocet [Propoxyphene N-Acetaminophen] Anaphylaxis  . Latex Anaphylaxis    Prescriptions prior to admission  Medication Sig Dispense Refill Last Dose  . HYDROcodone-acetaminophen (HYCET) 7.5-325 mg/15 ml solution Take 15 mLs by mouth every 6 (six) hours as needed for moderate pain.  120 mL 0     Review of Systems  Constitutional: Negative for fever and chills.  Eyes: Negative for blurred vision and double vision.  Respiratory: Negative for cough and shortness of breath.   Cardiovascular: Negative for chest pain, palpitations and leg swelling.  Gastrointestinal: Negative for nausea, vomiting, abdominal pain and diarrhea.  Genitourinary: Negative for dysuria and hematuria.  Neurological: Positive for headaches. Negative for loss of consciousness.   Physical Exam   Blood pressure 187/103, pulse 95, temperature 98.6 F (37 C), temperature source Oral, resp. rate 20, height 5\' 2"  (1.575 m), weight 169.192 kg (373 lb).  Physical Exam  Constitutional: She is oriented to person, place, and time. She appears well-developed and well-nourished. No distress.  obese  HENT:  Head: Normocephalic and atraumatic.  Eyes: Conjunctivae and EOM are normal. Pupils are equal, round, and reactive to light.  Neck: Normal range of motion.  Cardiovascular: Normal rate and regular rhythm.   Respiratory: Effort normal and breath sounds normal. No respiratory distress.  GI: Soft. She exhibits no distension. There is no tenderness.  Musculoskeletal: Normal range of motion. She exhibits edema.  2+ pitting LE edema  b/l  Neurological: She is alert and oriented to person, place, and time. She has normal reflexes. Coordination normal.  Skin: Skin is warm and dry. She is not diaphoretic.  Psychiatric: She has a normal mood and affect. Her behavior is normal. Thought content normal.    MAU Course  Procedures  MDM FHT reassuring:  bpm 145, with moderate variability, accels no decels UC: none on monitor  Assessment and Plan  A: Patient is 29 y.o. G36P0 [redacted]w[redacted]d here with complaints of decreased fetal movement     Great fetal movement on monitor; pt unable to feel at this time     BP of 187/103 on arrival     Improved to 140s/70s at time of my exam     Headache, edema no vision changes      P: Eval for for pre-eclampsia: Pr/Cr ratio wnl, Platelets ok, LFTs wnl     Discharge to home with pre-eclampsia precautions     Discussed continuing to monitor fetal kick counts     Keep scheduled OB appts     Henson,Amber 07/30/2014, 6:27 PM   OB fellow attestation:  I have seen and examined this patient; I agree with above documentation in the resident's note.   Parris Kindig is a 29 y.o. G2P0 reporting decreased fetal movement and lower extremity edema.  Hx of previous elective cesarean section (per pt) +FM, denies LOF, VB, contractions, vaginal discharge.  PE: BP 129/69 mmHg  Pulse 90  Temp(Src) 98.6 F (37 C) (Oral)  Resp 18  Ht 5\' 2"  (1.575 m)  Wt 373 lb (169.192 kg)  BMI 68.21 kg/m2 Gen: calm comfortable, NAD Resp: normal effort, no distress Abd: gravid LE: 1+, 2+ reflexes, no clonus  ROS, labs, PMH reviewed NST reactive  Plan: - fetal kick counts reinforced, labor precautions - continue routine follow up in OB clinic - initial BP elevated, preE labs nml.  Patient advised to follow up with provider for repeat cesarean section.  Merla Riches, MD 2:32 PM

## 2014-07-30 NOTE — MAU Note (Addendum)
Denies headache, visual changes, epigastric pain. Reports increase in swelling.  States BP has not been up. Scheduled c/s at [facility]

## 2015-06-04 ENCOUNTER — Encounter (HOSPITAL_COMMUNITY): Payer: Self-pay | Admitting: *Deleted

## 2016-01-19 ENCOUNTER — Emergency Department (HOSPITAL_BASED_OUTPATIENT_CLINIC_OR_DEPARTMENT_OTHER)
Admission: EM | Admit: 2016-01-19 | Discharge: 2016-01-19 | Disposition: A | Discharge status: Home / Self Care | Payer: BLUE CROSS/BLUE SHIELD | Attending: Emergency Medicine | Admitting: Emergency Medicine

## 2016-01-19 ENCOUNTER — Encounter (HOSPITAL_BASED_OUTPATIENT_CLINIC_OR_DEPARTMENT_OTHER): Payer: Self-pay | Admitting: Emergency Medicine

## 2016-01-19 DIAGNOSIS — H9190 Unspecified hearing loss, unspecified ear: Secondary | ICD-10-CM | POA: Diagnosis not present

## 2016-01-19 DIAGNOSIS — Z791 Long term (current) use of non-steroidal anti-inflammatories (NSAID): Secondary | ICD-10-CM | POA: Diagnosis not present

## 2016-01-19 DIAGNOSIS — G8929 Other chronic pain: Secondary | ICD-10-CM | POA: Diagnosis not present

## 2016-01-19 DIAGNOSIS — M545 Low back pain: Secondary | ICD-10-CM | POA: Diagnosis not present

## 2016-01-19 DIAGNOSIS — Z79899 Other long term (current) drug therapy: Secondary | ICD-10-CM | POA: Diagnosis not present

## 2016-01-19 DIAGNOSIS — J069 Acute upper respiratory infection, unspecified: Secondary | ICD-10-CM | POA: Diagnosis not present

## 2016-01-19 DIAGNOSIS — R197 Diarrhea, unspecified: Secondary | ICD-10-CM | POA: Diagnosis not present

## 2016-01-19 DIAGNOSIS — J45909 Unspecified asthma, uncomplicated: Secondary | ICD-10-CM | POA: Insufficient documentation

## 2016-01-19 DIAGNOSIS — R509 Fever, unspecified: Secondary | ICD-10-CM | POA: Diagnosis present

## 2016-01-19 NOTE — ED Triage Notes (Signed)
Patient reports that she is having fever, chills and productive cough x 2 -3 days.

## 2016-01-19 NOTE — ED Provider Notes (Signed)
[facility] Provider Note   CSN: [id number] Arrival date & time: 01/19/16  2151  By signing my name below, I, Reola Mosher, attest that this documentation has been prepared under the direction and in the presence of Fatima Blank, MD. Electronically Signed: Reola Mosher, ED Scribe. 01/19/16. 11:15 PM.  History   Chief Complaint Chief Complaint  Patient presents with  . Fever   The history is provided by the patient. No language interpreter was used.   HPI Comments: Alexis Blake is a 30 y.o. female with a PMHx of asthma and HTN, who presents to the Emergency Department complaining of gradual onset, waxing and waning fever (Tmax 103) onset ~2-3 days. Pt reports associated chills, productive cough w/ yellow sputum, rhinorrhea, congestion, sore throat, nausea, loose/watery diarrhea, and decreased hearing of the right ear, all secondary to her fever. Pt additionally reports that she has chronic lower back pain, but notes that this is a baseline problem for her and it has not acutely changed recently. She has been taking Trazodone, Tramadol, and Ibuprofen with minimal relief of her symptoms. Pt states that her friend had similar symptoms prior to the onset of her symptoms. She notes that she has a hx of similar symptoms with dx'd strep throat ~1 year ago. Pt is sexually active and uses barrier protection each time. No hx of UTIs or DM. Pt has had PNA in the past. Pt is a non-smoker, and does not drink alcohol daily. No illicit drug usage. Denies vomiting, bloody stools, abdominal pain, frequency, urgency, dysuria, ear pain, vaginal bleeding, vaginal discharge, or any other associated symptoms.   Past Medical History:  Diagnosis Date  . Asthma    There are no active problems to display for this patient.  Past Surgical History:  Procedure Laterality Date  . CESAREAN SECTION      OB History    Gravida Para Term Preterm AB Living   2         1   SAB TAB  Ectopic Multiple Live Births                 Home Medications    Prior to Admission medications   Medication Sig Start Date End Date Taking? Authorizing Provider  traMADol (ULTRAM) 50 MG tablet Take by mouth every 6 (six) hours as needed.   Yes Historical Provider, MD  traZODone (DESYREL) 50 MG tablet Take 50 mg by mouth at bedtime.   Yes Historical Provider, MD  acetaminophen (TYLENOL) 500 MG tablet Take 1,000 mg by mouth every 6 (six) hours as needed for moderate pain or headache.    Historical Provider, MD  Prenatal Vit-Fe Fumarate-FA (PRENATAL MULTIVITAMIN) TABS tablet Take 1 tablet by mouth at bedtime.    Historical Provider, MD   Family History Family History  Problem Relation Age of Onset  . Alcohol abuse Neg Hx   . Arthritis Neg Hx   . Asthma Neg Hx   . Birth defects Neg Hx   . Cancer Neg Hx   . COPD Neg Hx   . Depression Neg Hx   . Diabetes Neg Hx   . Drug abuse Neg Hx   . Early death Neg Hx   . Hearing loss Neg Hx   . Heart disease Neg Hx   . Hyperlipidemia Neg Hx   . Hypertension Neg Hx   . Kidney disease Neg Hx   . Learning disabilities Neg Hx   . Mental illness Neg Hx   .  Mental retardation Neg Hx   . Miscarriages / Stillbirths Neg Hx   . Stroke Neg Hx   . Vision loss Neg Hx   . Varicose Veins Neg Hx    Social History Social History  Substance Use Topics  . Smoking status: Never Smoker  . Smokeless tobacco: Never Used  . Alcohol use Yes     Comment: occ   Allergies   Darvocet [propoxyphene n-acetaminophen] and Latex  Review of Systems Review of Systems  Constitutional: Positive for chills and fever (Tmax 103).  HENT: Positive for congestion, hearing loss, rhinorrhea and sore throat.   Respiratory: Positive for cough.   Gastrointestinal: Positive for diarrhea and nausea. Negative for abdominal pain, blood in stool and vomiting.  Genitourinary: Negative for dysuria, frequency, hematuria, urgency, vaginal bleeding and vaginal discharge.    Musculoskeletal: Positive for back pain (lumbar).  All other systems reviewed and are negative.  Physical Exam Updated Vital Signs BP 136/97 (BP Location: Right Wrist)   Pulse 90   Temp 98.9 F (37.2 C) (Oral)   Resp 20   Ht 5\' 3"  (1.6 m)   Wt 272 lb (123.4 kg)   SpO2 99%   BMI 48.18 kg/m   Physical Exam  Constitutional: She is oriented to person, place, and time. She appears well-developed and well-nourished. No distress.  HENT:  Head: Normocephalic and atraumatic.  Right Ear: Tympanic membrane and external ear normal.  Left Ear: Tympanic membrane and external ear normal.  Nose: Nose normal.  Mouth/Throat: Oropharynx is clear and moist. No oropharyngeal exudate.  No erythema, swelling, or exudates. No petechia of the soft palette.   Eyes: Conjunctivae and EOM are normal. Pupils are equal, round, and reactive to light. Right eye exhibits no discharge. Left eye exhibits no discharge. No scleral icterus.  Neck: Normal range of motion. Neck supple.  Cardiovascular: Normal rate, regular rhythm and normal heart sounds.  Exam reveals no gallop and no friction rub.   No murmur heard. Pulmonary/Chest: Effort normal and breath sounds normal. No stridor. No respiratory distress. She has no wheezes. She has no rales.  Abdominal: Soft. She exhibits no distension. There is no tenderness.  Musculoskeletal: She exhibits no edema.       Thoracic back: She exhibits tenderness.       Back:  Lymphadenopathy:    She has no cervical adenopathy.  Neurological: She is alert and oriented to person, place, and time.  Skin: Skin is warm and dry. No rash noted. She is not diaphoretic. No erythema.  Psychiatric: She has a normal mood and affect.  Vitals reviewed.  ED Treatments / Results  DIAGNOSTIC STUDIES: Oxygen Saturation is 99% on RA, normal by my interpretation.   COORDINATION OF CARE: 11:15 PM-Discussed next steps with pt. Pt verbalized understanding and is agreeable with the plan.    Labs (all labs ordered are listed, but only abnormal results are displayed) Labs Reviewed - No data to display  EKG  EKG Interpretation None       Radiology No results found.  Procedures Procedures (including critical care time)  Medications Ordered in ED Medications - No data to display   Initial Impression / Assessment and Plan / ED Course  I have reviewed the triage vital signs and the nursing notes.  Pertinent labs & imaging results that were available during my care of the patient were reviewed by me and considered in my medical decision making (see chart for details).  Clinical Course    30 y.o.  female presents with cough, rhinorrhea, fever, and sore throat for 2 days. adequate oral hydration. Rest of history as above.  Patient appears well. No signs of toxicity. No hypoxia, tachypnea or other signs of respiratory distress. No sign of clinical dehydration. Lung exam clear. Rest of exam as above.  Most consistent with viral upper respiratory infection.   1/4 centor criteria. Low suspicion for Strep throat.  No evidence suggestive of pharyngitis, AOM, PNA, or meningitis.   Chest x-ray not indicated at this time.  Discussed symptomatic treatment with the parents and they will follow closely with their PCP.      Final Clinical Impressions(s) / ED Diagnoses   Final diagnoses:  URI (upper respiratory infection)   Disposition: Discharge  Condition: Good  I have discussed the results, Dx and Tx plan with the patient who expressed understanding and agree(s) with the plan. Discharge instructions discussed at great length. The patient was given strict return precautions who verbalized understanding of the instructions. No further questions at time of discharge.    New Prescriptions   No medications on file    Follow Up: [facility] [address] [place] [phone] Call  For help  establishing care with a care provider   I personally performed the services described in this documentation, which was scribed in my presence. The recorded information has been reviewed and is accurate.        Fatima Blank, MD 01/19/16 [phone]

## 2016-10-04 ENCOUNTER — Encounter (HOSPITAL_BASED_OUTPATIENT_CLINIC_OR_DEPARTMENT_OTHER): Payer: Self-pay

## 2016-10-04 ENCOUNTER — Emergency Department (HOSPITAL_BASED_OUTPATIENT_CLINIC_OR_DEPARTMENT_OTHER)
Admission: EM | Admit: 2016-10-04 | Discharge: 2016-10-04 | Disposition: A | Discharge status: Home / Self Care | Payer: BLUE CROSS/BLUE SHIELD | Attending: Emergency Medicine | Admitting: Emergency Medicine

## 2016-10-04 DIAGNOSIS — Z975 Presence of (intrauterine) contraceptive device: Secondary | ICD-10-CM | POA: Insufficient documentation

## 2016-10-04 DIAGNOSIS — I10 Essential (primary) hypertension: Secondary | ICD-10-CM | POA: Insufficient documentation

## 2016-10-04 DIAGNOSIS — Z91040 Latex allergy status: Secondary | ICD-10-CM | POA: Diagnosis not present

## 2016-10-04 DIAGNOSIS — N760 Acute vaginitis: Secondary | ICD-10-CM | POA: Diagnosis not present

## 2016-10-04 DIAGNOSIS — R102 Pelvic and perineal pain: Secondary | ICD-10-CM | POA: Diagnosis present

## 2016-10-04 DIAGNOSIS — J45909 Unspecified asthma, uncomplicated: Secondary | ICD-10-CM | POA: Insufficient documentation

## 2016-10-04 DIAGNOSIS — B9689 Other specified bacterial agents as the cause of diseases classified elsewhere: Secondary | ICD-10-CM

## 2016-10-04 LAB — URINALYSIS, MICROSCOPIC (REFLEX)

## 2016-10-04 LAB — URINALYSIS, ROUTINE W REFLEX MICROSCOPIC
Bilirubin Urine: NEGATIVE
GLUCOSE, UA: NEGATIVE mg/dL
Ketones, ur: NEGATIVE mg/dL
Nitrite: NEGATIVE
Protein, ur: 30 mg/dL — AB
SPECIFIC GRAVITY, URINE: 1.026 (ref 1.005–1.030)
pH: 6 (ref 5.0–8.0)

## 2016-10-04 LAB — PREGNANCY, URINE: PREG TEST UR: NEGATIVE

## 2016-10-04 LAB — WET PREP, GENITAL
Sperm: NONE SEEN
TRICH WET PREP: NONE SEEN
Yeast Wet Prep HPF POC: NONE SEEN

## 2016-10-04 MED ORDER — METRONIDAZOLE 500 MG PO TABS: 500.0000 mg | ORAL_TABLET | Freq: Two times a day (BID) | ORAL | 0 refills | Status: DC

## 2016-10-04 NOTE — ED Provider Notes (Signed)
[facility] Provider Note   CSN: [id number] Arrival date & time: 10/04/16  2023  By signing my name below, I, Alexis Blake, attest that this documentation has been prepared under the direction and in the presence of [name], MD. Electronically Signed: Levester Blake, Scribe. 10/04/2016. 9:06 PM.  History   Chief Complaint Chief Complaint  Patient presents with  . Pelvic Pain   HPI Comments Alexis Blake is a 31 y.o. female with a PMHx significant for asthma, HTN, and morbid obesity, s/p c-section, cholecystectomy and lap gastrectomy, who presents to the Emergency Department with complaints of constant pelvic pain x3 days, with new radiation to the back x1 day.  No chest pain, headache, abdominal pain or dyspnea. No notable worsening or relieving factors. Pain rated a 8/10 in severity, associated with abnormal milky vaginal discharge and vaginal irritation with blood in the setting of pt vigorously scrubbing and applying apple cider vinegar to vaginal area. Pt with new sexual partner with whom she has unprotected intercourse.  IUD in place for birth control.  Pt denies experiencing any other acute sx. She has not attempted to treat her sx with any OTC medication.  The history is provided by the patient and medical records. No language interpreter was used.   Past Medical History:  Diagnosis Date  . Asthma    There are no active problems to display for this patient.   Past Surgical History:  Procedure Laterality Date  . BARIATRIC SURGERY    . CESAREAN SECTION    . CHOLECYSTECTOMY     OB History    Gravida Para Term Preterm AB Living   2         1   SAB TAB Ectopic Multiple Live Births                  Home Medications    Prior to Admission medications   Not on File    Family History Family History  Problem Relation Age of Onset  . Alcohol abuse Neg Hx   . Arthritis Neg Hx   . Asthma Neg Hx   . Birth defects Neg Hx   . Cancer Neg Hx   .  COPD Neg Hx   . Depression Neg Hx   . Diabetes Neg Hx   . Drug abuse Neg Hx   . Early death Neg Hx   . Hearing loss Neg Hx   . Heart disease Neg Hx   . Hyperlipidemia Neg Hx   . Hypertension Neg Hx   . Kidney disease Neg Hx   . Learning disabilities Neg Hx   . Mental illness Neg Hx   . Mental retardation Neg Hx   . Miscarriages / Stillbirths Neg Hx   . Stroke Neg Hx   . Vision loss Neg Hx   . Varicose Veins Neg Hx     Social History Social History  Substance Use Topics  . Smoking status: Never Smoker  . Smokeless tobacco: Never Used  . Alcohol use Yes     Comment: weekly     Allergies   Darvocet [propoxyphene n-acetaminophen] and Latex   Review of Systems Review of Systems   All systems reviewed and are negative for acute change except as noted in the HPI.  Physical Exam Updated Vital Signs BP (!) 144/103 (BP Location: Left Arm)   Pulse 73   Temp 99.1 F (37.3 C) (Oral)   Resp 18   Ht 5\' 2"  (1.575 m)  Wt (!) 307 lb (139.3 kg)   SpO2 98%   BMI 56.15 kg/m   Physical Exam  Constitutional: She is oriented to person, place, and time. She appears well-developed and well-nourished.  HENT:  Head: Normocephalic.  Eyes: EOM are normal.  Neck: Normal range of motion.  Pulmonary/Chest: Effort normal.  Abdominal: She exhibits no distension.  Genitourinary:  Genitourinary Comments: Chaperone present.  Normal external genitalia.  Scant thin vaginal discharge.  Scant vaginal bleeding.  No obvious thicker malodorous discharge.  Musculoskeletal: Normal range of motion.  Neurological: She is alert and oriented to person, place, and time.  Psychiatric: She has a normal mood and affect.  Nursing note and vitals reviewed.  ED Treatments / Results  DIAGNOSTIC STUDIES: Oxygen Saturation is 98% on room air, normal by my interpretation.    COORDINATION OF CARE: 9:03 PM Discussed treatment plan with pt at bedside and pt agreed to plan.  Labs (all labs ordered are  listed, but only abnormal results are displayed) Labs Reviewed  WET PREP, GENITAL  URINALYSIS, ROUTINE W REFLEX MICROSCOPIC  PREGNANCY, URINE  GC/CHLAMYDIA PROBE AMP ([place]) NOT AT [facility]    EKG  EKG Interpretation None       Radiology No results found.  Procedures Procedures (including critical care time)  Medications Ordered in ED Medications - No data to display   Initial Impression / Assessment and Plan / ED Course  I have reviewed the triage vital signs and the nursing notes.  Pertinent labs & imaging results that were available during my care of the patient were reviewed by me and considered in my medical decision making (see chart for details).     Overall well-appearing.  This is likely bacterial vaginosis.  GC and Chlamydia sent.  No signs of PID.  Overall well-appearing.  Abdomen is nontender.  Patient be started on Flagyl.  Standard precautions given   I personally performed the services described in this documentation, which was scribed in my presence. The recorded information has been reviewed and is accurate.   Final Clinical Impressions(s) / ED Diagnoses   Final diagnoses:  BV (bacterial vaginosis)    New Prescriptions New Prescriptions   METRONIDAZOLE (FLAGYL) 500 MG TABLET    Take 1 tablet (500 mg total) by mouth 2 (two) times daily.     [name], MD 10/04/16 2200

## 2016-10-04 NOTE — ED Notes (Signed)
Pt verbalizes understanding of d/c instructions and denies any further needs at this time. 

## 2016-10-04 NOTE — ED Triage Notes (Signed)
C/o pelvic pain, vaginal d/c and bleeding x 3 days-NAD-steady gait

## 2016-10-05 LAB — GC/CHLAMYDIA PROBE AMP (~~LOC~~) NOT AT ARMC
Chlamydia: NEGATIVE
Neisseria Gonorrhea: NEGATIVE

## 2016-11-19 ENCOUNTER — Emergency Department (HOSPITAL_BASED_OUTPATIENT_CLINIC_OR_DEPARTMENT_OTHER): Payer: BLUE CROSS/BLUE SHIELD

## 2016-11-19 ENCOUNTER — Emergency Department (HOSPITAL_BASED_OUTPATIENT_CLINIC_OR_DEPARTMENT_OTHER)
Admission: EM | Admit: 2016-11-19 | Discharge: 2016-11-19 | Disposition: A | Discharge status: Home / Self Care | Payer: BLUE CROSS/BLUE SHIELD | Attending: Emergency Medicine | Admitting: Emergency Medicine

## 2016-11-19 ENCOUNTER — Encounter (HOSPITAL_BASED_OUTPATIENT_CLINIC_OR_DEPARTMENT_OTHER): Payer: Self-pay | Admitting: Emergency Medicine

## 2016-11-19 DIAGNOSIS — Z91040 Latex allergy status: Secondary | ICD-10-CM | POA: Diagnosis not present

## 2016-11-19 DIAGNOSIS — R1032 Left lower quadrant pain: Secondary | ICD-10-CM | POA: Insufficient documentation

## 2016-11-19 DIAGNOSIS — N939 Abnormal uterine and vaginal bleeding, unspecified: Secondary | ICD-10-CM | POA: Diagnosis not present

## 2016-11-19 DIAGNOSIS — J45909 Unspecified asthma, uncomplicated: Secondary | ICD-10-CM | POA: Diagnosis not present

## 2016-11-19 LAB — CBC
HEMATOCRIT: 41.4 % (ref 36.0–46.0)
Hemoglobin: 14.7 g/dL (ref 12.0–15.0)
MCH: 30.5 pg (ref 26.0–34.0)
MCHC: 35.5 g/dL (ref 30.0–36.0)
MCV: 85.9 fL (ref 78.0–100.0)
PLATELETS: 286 10*3/uL (ref 150–400)
RBC: 4.82 MIL/uL (ref 3.87–5.11)
RDW: 13.4 % (ref 11.5–15.5)
WBC: 6.2 10*3/uL (ref 4.0–10.5)

## 2016-11-19 LAB — URINALYSIS, ROUTINE W REFLEX MICROSCOPIC
BILIRUBIN URINE: NEGATIVE
Glucose, UA: NEGATIVE mg/dL
HGB URINE DIPSTICK: NEGATIVE
Ketones, ur: NEGATIVE mg/dL
Leukocytes, UA: NEGATIVE
NITRITE: NEGATIVE
PROTEIN: NEGATIVE mg/dL
SPECIFIC GRAVITY, URINE: 1.022 (ref 1.005–1.030)
pH: 7.5 (ref 5.0–8.0)

## 2016-11-19 LAB — PREGNANCY, URINE: PREG TEST UR: NEGATIVE

## 2016-11-19 MED ORDER — HYDROCODONE-ACETAMINOPHEN 5-325 MG PO TABS: 2.0000 | ORAL_TABLET | Freq: Once | ORAL | Status: DC

## 2016-11-19 MED ORDER — MORPHINE SULFATE (PF) 4 MG/ML IV SOLN: 4.0000 mg | Freq: Once | INTRAVENOUS | Status: DC

## 2016-11-19 MED ORDER — ACETAMINOPHEN 325 MG PO TABS
650.0000 mg | ORAL_TABLET | Freq: Once | ORAL | Status: AC
  Administered 2016-11-19: 650 mg via ORAL
  Filled 2016-11-19: qty 2

## 2016-11-19 MED ORDER — MEDROXYPROGESTERONE ACETATE 10 MG PO TABS: 10.0000 mg | ORAL_TABLET | Freq: Every day | ORAL | 0 refills | Status: DC

## 2016-11-19 NOTE — ED Notes (Signed)
ED Provider at bedside. 

## 2016-11-19 NOTE — Discharge Instructions (Signed)
We will start you on provera which is hormonal pill that will help you you regulate the vaginal bleeding. Your pelvic US was normal and your blood work was not concerning for anemia or infection. Please call your Gynecologist office on Monday and see he y can schedule your you for an earlier appointment. If symptoms do not improve and continue to be bothersome, I wold strongly recommend going the [facility] hospital.

## 2016-11-19 NOTE — ED Triage Notes (Signed)
Pt c/o LLQ pain w/ radiation to back and on/off spotting x 1 mo

## 2016-11-19 NOTE — ED Notes (Signed)
Patient transported to Ultrasound 

## 2016-11-19 NOTE — ED Provider Notes (Signed)
[facility]PT [facility] Provider Note   CSN: [id number] Arrival date & time: 11/19/16  1314     History   Chief Complaint Chief Complaint  Patient presents with  . Abdominal Pain    HPI Warrene Kapfer is a 31 y.o. female with a past medical history significant for asthma, HTN, and morbid obesity, s/p c-section, cholecystectomy and lap gastrectomy who present today complaining of LLQ pain radiating to her back. Patient was seen on 5/29 for similar complain and found to have bacterial vaginosis and was prescribed flagyl. Since then, patient continue to endorse pain has been taking tylenol with minimal improvement in her symptoms. Patient also reports intemittent vaginal bleeding and clotting. Patient does not have a regular cycle since she had her IUD (Mirena) place in 2016. Patient also reports bilateral calves pain associated with her abdominal pain. Patient denies any vaginal discharge, CP, SOB, palpitations, nausea and vomiting. On last admission, IUD was found to be in placed and GC and chlamydia was negative.  HPI  Past Medical History:  Diagnosis Date  . Asthma     There are no active problems to display for this patient.   Past Surgical History:  Procedure Laterality Date  . BARIATRIC SURGERY    . CESAREAN SECTION    . CHOLECYSTECTOMY      OB History    Gravida Para Term Preterm AB Living   2         1   SAB TAB Ectopic Multiple Live Births                   Home Medications    Prior to Admission medications   Medication Sig Start Date End Date Taking? Authorizing Provider  medroxyPROGESTERone (PROVERA) 10 MG tablet Take 1 tablet (10 mg total) by mouth daily. 11/19/16 11/19/17  Raheen Capili, Earna Coder, MD  metroNIDAZOLE (FLAGYL) 500 MG tablet Take 1 tablet (500 mg total) by mouth 2 (two) times daily. 10/04/16   [name], MD    Family History Family History  Problem Relation Age of Onset  . Alcohol abuse Neg Hx   . Arthritis Neg Hx   . Asthma Neg Hx   .  Birth defects Neg Hx   . Cancer Neg Hx   . COPD Neg Hx   . Depression Neg Hx   . Diabetes Neg Hx   . Drug abuse Neg Hx   . Early death Neg Hx   . Hearing loss Neg Hx   . Heart disease Neg Hx   . Hyperlipidemia Neg Hx   . Hypertension Neg Hx   . Kidney disease Neg Hx   . Learning disabilities Neg Hx   . Mental illness Neg Hx   . Mental retardation Neg Hx   . Miscarriages / Stillbirths Neg Hx   . Stroke Neg Hx   . Vision loss Neg Hx   . Varicose Veins Neg Hx     Social History Social History  Substance Use Topics  . Smoking status: Never Smoker  . Smokeless tobacco: Never Used  . Alcohol use Yes     Comment: weekly     Allergies   Darvocet [propoxyphene n-acetaminophen] and Latex   Review of Systems Review of Systems  Constitutional: Negative for fever.  HENT: Negative.   Eyes: Negative.   Respiratory: Negative.   Cardiovascular: Negative.   Gastrointestinal: Positive for abdominal pain.  Endocrine: Negative.   Genitourinary: Positive for flank pain, pelvic pain and vaginal bleeding.  Musculoskeletal: Positive  for back pain.  Skin: Negative.   Allergic/Immunologic: Negative.   Neurological: Negative.   Hematological: Negative.   Psychiatric/Behavioral: Negative.      Physical Exam Updated Vital Signs BP 111/63 (BP Location: Right Arm)   Pulse 76   Temp 98.4 F (36.9 C) (Oral)   Resp 20   SpO2 97%   Physical Exam  Constitutional: She is oriented to person, place, and time. She appears well-developed.  HENT:  Head: Normocephalic and atraumatic.  Eyes: Pupils are equal, round, and reactive to light. EOM are normal.  Neck: Normal range of motion. Neck supple.  Cardiovascular: Normal rate and regular rhythm.   Pulmonary/Chest: Effort normal and breath sounds normal.  Abdominal: Soft. Bowel sounds are normal.  LLQ pain on palpation, patient reports that pain radiate to her back, no mass palpated. Abdominal exam is limited due to patient body habitus.     Genitourinary: No vaginal discharge found.  Musculoskeletal: Normal range of motion.  Neurological: She is alert and oriented to person, place, and time.  Skin: Skin is warm and dry.     ED Treatments / Results  Labs (all labs ordered are listed, but only abnormal results are displayed) Labs Reviewed  URINALYSIS, ROUTINE W REFLEX MICROSCOPIC - Abnormal; Notable for the following:       Result Value   APPearance CLOUDY (*)    All other components within normal limits  PREGNANCY, URINE  CBC    EKG  EKG Interpretation None       Radiology US Transvaginal Non-ob  Result Date: 11/19/2016 CLINICAL DATA:  Left lower quadrant pain, vaginal bleeding. EXAM: TRANSABDOMINAL AND TRANSVAGINAL ULTRASOUND OF PELVIS TECHNIQUE: Both transabdominal and transvaginal ultrasound examinations of the pelvis were performed. Transabdominal technique was performed for global imaging of the pelvis including uterus, ovaries, adnexal regions, and pelvic cul-de-sac. It was necessary to proceed with endovaginal exam following the transabdominal exam to visualize the uterus, endometrium, ovaries and adnexa . COMPARISON:  None FINDINGS: Uterus Measurements: 11.1 x 6.0 x 8.1 cm. No fibroids or other mass visualized. Endometrium Thickness: 5 mm in thickness. IUD noted in expected position. No focal abnormality visualized. Right ovary Measurements: Not visualized. No adnexal mass seen. Left ovary Measurements: Not visualize. No adnexal mass seen. Other findings No abnormal free fluid. IMPRESSION: No acute findings. Electronically Signed   By: Rolm Baptise M.D.   On: 11/19/2016 17:48   US Pelvis Complete  Result Date: 11/19/2016 CLINICAL DATA:  Left lower quadrant pain, vaginal bleeding. EXAM: TRANSABDOMINAL AND TRANSVAGINAL ULTRASOUND OF PELVIS TECHNIQUE: Both transabdominal and transvaginal ultrasound examinations of the pelvis were performed. Transabdominal technique was performed for global imaging of the pelvis  including uterus, ovaries, adnexal regions, and pelvic cul-de-sac. It was necessary to proceed with endovaginal exam following the transabdominal exam to visualize the uterus, endometrium, ovaries and adnexa . COMPARISON:  None FINDINGS: Uterus Measurements: 11.1 x 6.0 x 8.1 cm. No fibroids or other mass visualized. Endometrium Thickness: 5 mm in thickness. IUD noted in expected position. No focal abnormality visualized. Right ovary Measurements: Not visualized. No adnexal mass seen. Left ovary Measurements: Not visualize. No adnexal mass seen. Other findings No abnormal free fluid. IMPRESSION: No acute findings. Electronically Signed   By: Rolm Baptise M.D.   On: 11/19/2016 17:48    Procedures Procedures (including critical care time)  Medications Ordered in ED Medications  HYDROcodone-acetaminophen (NORCO/VICODIN) 5-325 MG per tablet 2 tablet (2 tablets Oral Not Given 11/19/16 1818)  acetaminophen (TYLENOL) tablet 650 mg (  650 mg Oral Given 11/19/16 1822)     Initial Impression / Assessment and Plan / ED Course  Patient is a 1c yo female who presents today with LLQ pain for about a month and some vaginal bleeding. Patient was seen a month for similar symptoms and diagnosed with BV. Pelvic exam was within normal limit with no concern for PID or other GU process.  Today, pelvic US and transvaginal US did not show any acute findings. UA was normal. Patient was discharged on provera to help with vaginal bleeding and will follow up with gyn early next week. Patient given return precautions and [facility] wor[facility] before she can be seen by her Gynecologist. I have reviewed the triage vital signs and the nursing notes.  Pertinent labs & imaging results that were available during my care of the patient were reviewed by me and considered in my medical decision making (see chart for details).     Final Clinical Impressions(s) / ED Diagnoses   Final diagnoses:  LLQ pain    Abnormal vaginal bleeding    New Prescriptions New Prescriptions   MEDROXYPROGESTERONE (PROVERA) 10 MG TABLET    Take 1 tablet (10 mg total) by mouth daily.     Marjie Skiff, MD 11/19/16 Ward Chatters    Veryl Speak, MD 11/20/16 0000

## 2017-05-15 ENCOUNTER — Other Ambulatory Visit: Payer: Self-pay

## 2017-05-15 ENCOUNTER — Encounter (HOSPITAL_BASED_OUTPATIENT_CLINIC_OR_DEPARTMENT_OTHER): Payer: Self-pay

## 2017-05-15 DIAGNOSIS — J45909 Unspecified asthma, uncomplicated: Secondary | ICD-10-CM | POA: Diagnosis not present

## 2017-05-15 DIAGNOSIS — J988 Other specified respiratory disorders: Secondary | ICD-10-CM | POA: Diagnosis not present

## 2017-05-15 DIAGNOSIS — B9789 Other viral agents as the cause of diseases classified elsewhere: Secondary | ICD-10-CM | POA: Diagnosis not present

## 2017-05-15 DIAGNOSIS — R05 Cough: Secondary | ICD-10-CM | POA: Diagnosis present

## 2017-05-15 NOTE — ED Triage Notes (Signed)
C/o flu like sx x 1 week-NAD-steady gait 

## 2017-05-16 ENCOUNTER — Emergency Department (HOSPITAL_BASED_OUTPATIENT_CLINIC_OR_DEPARTMENT_OTHER)
Admission: EM | Admit: 2017-05-16 | Discharge: 2017-05-16 | Disposition: A | Discharge status: Home / Self Care | Payer: BLUE CROSS/BLUE SHIELD | Attending: Emergency Medicine | Admitting: Emergency Medicine

## 2017-05-16 ENCOUNTER — Emergency Department (HOSPITAL_BASED_OUTPATIENT_CLINIC_OR_DEPARTMENT_OTHER): Payer: BLUE CROSS/BLUE SHIELD

## 2017-05-16 DIAGNOSIS — B9789 Other viral agents as the cause of diseases classified elsewhere: Secondary | ICD-10-CM

## 2017-05-16 DIAGNOSIS — J988 Other specified respiratory disorders: Secondary | ICD-10-CM

## 2017-05-16 LAB — RAPID STREP SCREEN (MED CTR MEBANE ONLY): Streptococcus, Group A Screen (Direct): NEGATIVE

## 2017-05-16 MED ORDER — DEXAMETHASONE SODIUM PHOSPHATE 10 MG/ML IJ SOLN
10.0000 mg | Freq: Once | INTRAMUSCULAR | Status: AC
  Administered 2017-05-16: 10 mg via INTRAMUSCULAR
  Filled 2017-05-16: qty 1

## 2017-05-16 NOTE — ED Provider Notes (Addendum)
[name] Note: [name], MD, FACEP  CSN: [id number] MRN: [id number] ARRIVAL: 05/15/17 at [place]: [facility]   HISTORY OF PRESENT ILLNESS  05/16/17 2:03 AM Alexis Blake is a 32 y.o. female with several days of cold symptoms.  Specifically she has had intermittent cough, nasal congestion and subjective fever.  She has also had a sore throat which she describes as "swallowing glass" and rates the pain as a 7 out of 10.  She has been taking over-the-counter cold medication without adequate relief.  She denies nausea, vomiting or diarrhea.  She has had some shortness of breath but none currently.   Past Medical History:  Diagnosis Date  . Asthma     Past Surgical History:  Procedure Laterality Date  . BARIATRIC SURGERY    . CESAREAN SECTION    . CHOLECYSTECTOMY      Family History  Problem Relation Age of Onset  . Alcohol abuse Neg Hx   . Arthritis Neg Hx   . Asthma Neg Hx   . Birth defects Neg Hx   . Cancer Neg Hx   . COPD Neg Hx   . Depression Neg Hx   . Diabetes Neg Hx   . Drug abuse Neg Hx   . Early death Neg Hx   . Hearing loss Neg Hx   . Heart disease Neg Hx   . Hyperlipidemia Neg Hx   . Hypertension Neg Hx   . Kidney disease Neg Hx   . Learning disabilities Neg Hx   . Mental illness Neg Hx   . Mental retardation Neg Hx   . Miscarriages / Stillbirths Neg Hx   . Stroke Neg Hx   . Vision loss Neg Hx   . Varicose Veins Neg Hx     Social History   Tobacco Use  . Smoking status: Never Smoker  . Smokeless tobacco: Never Used  Substance Use Topics  . Alcohol use: Yes    Comment: occ  . Drug use: No    Prior to Admission medications   Medication Sig Start Date End Date Taking? Authorizing Provider  PANTOPRAZOLE SODIUM PO Take by mouth.   Yes [provider]  TRAZODONE HCL PO Take by mouth.   Yes [provider]    Allergies Darvocet [propoxyphene n-acetaminophen] and  Latex   REVIEW OF SYSTEMS  Negative except as noted here or in the History of Present Illness.   PHYSICAL EXAMINATION  Initial Vital Signs Blood pressure (!) 146/96, pulse 66, temperature 98.1 F (36.7 C), temperature source Oral, resp. rate 16, height 5' 2.25" (1.581 m), weight (!) 146.3 kg (322 lb 8 oz), SpO2 100 %, unknown if currently breastfeeding.  Examination General: Well-developed, obese female in no acute distress; appearance consistent with age of record HENT: normocephalic; atraumatic; no pharyngeal erythema or exudate; no dysphonia Eyes: pupils equal, round and reactive to light; extraocular muscles intact Neck: supple Heart: regular rate and rhythm Lungs: clear to auscultation bilaterally Abdomen: soft; nondistended; nontender; bowel sounds present Extremities: No deformity; full range of motion; pulses normal Neurologic: Awake, alert and oriented; motor function intact in all extremities and symmetric; no facial droop Skin: Warm and dry Psychiatric: Normal mood and affect   RESULTS  Summary of this visit's results, reviewed by myself:   EKG Interpretation  Date/Time:    Ventricular Rate:    PR Interval:    QRS Duration:   QT Interval:    QTC  Calculation:   R Axis:     Text Interpretation:        Laboratory Studies: Results for orders placed or performed during the hospital encounter of 05/16/17 (from the past 24 hour(s))  Rapid strep screen     Status: None   Collection Time: 05/16/17 12:46 AM  Result Value Ref Range   Streptococcus, Group A Screen (Direct) NEGATIVE NEGATIVE   Imaging Studies: No results found.  ED COURSE  Nursing notes and initial vitals signs, including pulse oximetry, reviewed.  Vitals:   05/15/17 2242  BP: (!) 146/96  Pulse: 66  Resp: 16  Temp: 98.1 F (36.7 C)  TempSrc: Oral  SpO2: 100%  Weight: (!) 146.3 kg (322 lb 8 oz)  Height: 5' 2.25" (1.581 m)   Patient declined offer of an inhaler.  PROCEDURES    ED  DIAGNOSES     ICD-10-CM   1. Viral respiratory illness J98.8    B97.89        [name], MD 05/16/17 5465    [name], MD 05/16/17 [phone]

## 2017-05-18 LAB — CULTURE, GROUP A STREP (THRC)

## 2017-09-28 ENCOUNTER — Encounter (HOSPITAL_BASED_OUTPATIENT_CLINIC_OR_DEPARTMENT_OTHER): Payer: Self-pay

## 2017-09-28 ENCOUNTER — Emergency Department (HOSPITAL_BASED_OUTPATIENT_CLINIC_OR_DEPARTMENT_OTHER)
Admission: EM | Admit: 2017-09-28 | Discharge: 2017-09-29 | Disposition: A | Discharge status: Home / Self Care | Payer: BLUE CROSS/BLUE SHIELD | Attending: Emergency Medicine | Admitting: Emergency Medicine

## 2017-09-28 ENCOUNTER — Other Ambulatory Visit: Payer: Self-pay

## 2017-09-28 DIAGNOSIS — N760 Acute vaginitis: Secondary | ICD-10-CM | POA: Diagnosis not present

## 2017-09-28 DIAGNOSIS — J45909 Unspecified asthma, uncomplicated: Secondary | ICD-10-CM | POA: Diagnosis not present

## 2017-09-28 DIAGNOSIS — T7840XA Allergy, unspecified, initial encounter: Secondary | ICD-10-CM | POA: Diagnosis present

## 2017-09-28 DIAGNOSIS — Z91040 Latex allergy status: Secondary | ICD-10-CM | POA: Diagnosis not present

## 2017-09-28 DIAGNOSIS — Z79899 Other long term (current) drug therapy: Secondary | ICD-10-CM | POA: Diagnosis not present

## 2017-09-28 LAB — URINALYSIS, ROUTINE W REFLEX MICROSCOPIC
Bilirubin Urine: NEGATIVE
Glucose, UA: NEGATIVE mg/dL
Hgb urine dipstick: NEGATIVE
Ketones, ur: NEGATIVE mg/dL
LEUKOCYTES UA: NEGATIVE
NITRITE: NEGATIVE
PH: 6 (ref 5.0–8.0)
Protein, ur: NEGATIVE mg/dL

## 2017-09-28 LAB — PREGNANCY, URINE: Preg Test, Ur: NEGATIVE

## 2017-09-28 NOTE — ED Provider Notes (Signed)
[facility] Provider Note: Alexis Spurling, MD, FACEP  CSN: [id number] MRN: [id number] ARRIVAL: 09/28/17 at 2036 ROOM: [facility]  Allergic Reaction (hives to vagina)   HISTORY OF PRESENT ILLNESS  09/28/17 11:41 PM Alexis Blake is a 32 y.o. female who was recently prescribed "gel" ([employer]roGel?)  For a vaginal bacterial infection.  She used it for the past 3 days but has subsequently developed severe pain in her vulvovaginal area.  She describes it as feeling like "I have been scraped with barbed wire then had salt sprinkled on it".  There is some associated white discharge.  Pain is worse with movement or urination.  She is not having vaginal bleeding.  She is not having abdominal pain.   Past Medical History:  Diagnosis Date  . Asthma     Past Surgical History:  Procedure Laterality Date  . BARIATRIC SURGERY    . CESAREAN SECTION    . CHOLECYSTECTOMY      Family History  Problem Relation Age of Onset  . Alcohol abuse Neg Hx   . Arthritis Neg Hx   . Asthma Neg Hx   . Birth defects Neg Hx   . Cancer Neg Hx   . COPD Neg Hx   . Depression Neg Hx   . Diabetes Neg Hx   . Drug abuse Neg Hx   . Early death Neg Hx   . Hearing loss Neg Hx   . Heart disease Neg Hx   . Hyperlipidemia Neg Hx   . Hypertension Neg Hx   . Kidney disease Neg Hx   . Learning disabilities Neg Hx   . Mental illness Neg Hx   . Mental retardation Neg Hx   . Miscarriages / Stillbirths Neg Hx   . Stroke Neg Hx   . Vision loss Neg Hx   . Varicose Veins Neg Hx     Social History   Tobacco Use  . Smoking status: Never Smoker  . Smokeless tobacco: Never Used  Substance Use Topics  . Alcohol use: Yes    Comment: occ  . Drug use: No    Prior to Admission medications   Medication Sig Start Date End Date Taking? Authorizing Provider  PANTOPRAZOLE SODIUM PO Take by mouth.    [provider]  TRAZODONE HCL PO Take by mouth.    [provider]     Allergies Darvocet [propoxyphene n-acetaminophen] and Latex   REVIEW OF SYSTEMS  Negative except as noted here or in the History of Present Illness.   PHYSICAL EXAMINATION  Initial Vital Signs Blood pressure (!) 147/105, pulse 80, temperature 98.4 F (36.9 C), temperature source Oral, resp. rate 18, height 5' 2.25" (1.581 m), weight 134.7 kg (297 lb), last menstrual period 09/16/2017, SpO2 97 %, unknown if currently breastfeeding.  Examination General: Well-developed, obese female in no acute distress; appearance consistent with age of record HENT: normocephalic; atraumatic Eyes: pupils equal, round and reactive to light; extraocular muscles intact Neck: supple Heart: regular rate and rhythm Lungs: clear to auscultation bilaterally Abdomen: soft; nondistended; nontender; bowel sounds present GU: Vulvovaginal inflammation with white, curd-like discharge Extremities: No deformity; full range of motion; pulses normal Neurologic: Awake, alert and oriented; motor function intact in all extremities and symmetric; no facial droop Skin: Warm and dry Psychiatric: Normal mood and affect   RESULTS  Summary of this visit's results, reviewed by myself:   EKG Interpretation  Date/Time:    Ventricular Rate:    PR  Interval:    QRS Duration:   QT Interval:    QTC Calculation:   R Axis:     Text Interpretation:        Laboratory Studies: Results for orders placed or performed during the hospital encounter of 09/28/17 (from the past 24 hour(s))  Pregnancy, urine     Status: None   Collection Time: 09/28/17  8:50 PM  Result Value Ref Range   Preg Test, Ur NEGATIVE NEGATIVE  Urinalysis, Routine w reflex microscopic     Status: Abnormal   Collection Time: 09/28/17  8:50 PM  Result Value Ref Range   Color, Urine YELLOW YELLOW   APPearance HAZY (A) CLEAR   Specific Gravity, Urine >1.030 (H) 1.005 - 1.030   pH 6.0 5.0 - 8.0   Glucose, UA NEGATIVE NEGATIVE mg/dL   Hgb urine  dipstick NEGATIVE NEGATIVE   Bilirubin Urine NEGATIVE NEGATIVE   Ketones, ur NEGATIVE NEGATIVE mg/dL   Protein, ur NEGATIVE NEGATIVE mg/dL   Nitrite NEGATIVE NEGATIVE   Leukocytes, UA NEGATIVE NEGATIVE  Wet prep, genital     Status: Abnormal   Collection Time: 09/28/17 11:52 PM  Result Value Ref Range   Yeast Wet Prep HPF POC NONE SEEN NONE SEEN   Trich, Wet Prep NONE SEEN NONE SEEN   Clue Cells Wet Prep HPF POC PRESENT (A) NONE SEEN   WBC, Wet Prep HPF POC MODERATE (A) NONE SEEN   Sperm NONE SEEN    Imaging Studies: No results found.  ED COURSE and MDM  Nursing notes and initial vitals signs, including pulse oximetry, reviewed.  Vitals:   09/28/17 2044 09/28/17 2046  BP: (!) 147/105   Pulse: 80   Resp: 18   Temp: 98.4 F (36.9 C)   TempSrc: Oral   SpO2: 97%   Weight:  134.7 kg (297 lb)  Height:  5' 2.25" (1.581 m)   The patient was advised to discontinue the vaginal gel as she is likely having allergic reaction to it.  Given the white, curd-like discharge will also treat for candidal vulvovaginitis.   PROCEDURES    ED DIAGNOSES     ICD-10-CM   1. Allergic reaction to drug, initial encounter T78.40XA   2. Vulvovaginitis N76.0        Ricquel Foulk, MD 09/29/17 [phone]

## 2017-09-28 NOTE — ED Notes (Signed)
Pt is c/o vaginal pain and swelling after using flagyl gel suppository.

## 2017-09-28 NOTE — Progress Notes (Signed)
Patient states that she is out of her albuterol inhaler. 

## 2017-09-28 NOTE — ED Triage Notes (Signed)
Pt states been on a vaginal cream for BV x3 days, this morning she noticed hives and swelling to her vaginal area; hx of asthma, no wheezing noted but states she is out of her inhaler

## 2017-09-29 LAB — WET PREP, GENITAL
Sperm: NONE SEEN
Trich, Wet Prep: NONE SEEN
Yeast Wet Prep HPF POC: NONE SEEN

## 2017-09-29 MED ORDER — FLUCONAZOLE 50 MG PO TABS
150.0000 mg | ORAL_TABLET | Freq: Once | ORAL | Status: AC
  Administered 2017-09-29: 150 mg via ORAL
  Filled 2017-09-29: qty 1

## 2017-12-30 ENCOUNTER — Other Ambulatory Visit: Payer: Self-pay

## 2017-12-30 ENCOUNTER — Emergency Department (HOSPITAL_BASED_OUTPATIENT_CLINIC_OR_DEPARTMENT_OTHER)
Admission: EM | Admit: 2017-12-30 | Discharge: 2017-12-31 | Disposition: A | Discharge status: Home / Self Care | Payer: BLUE CROSS/BLUE SHIELD | Attending: Emergency Medicine | Admitting: Emergency Medicine

## 2017-12-30 ENCOUNTER — Emergency Department (HOSPITAL_BASED_OUTPATIENT_CLINIC_OR_DEPARTMENT_OTHER): Payer: BLUE CROSS/BLUE SHIELD

## 2017-12-30 ENCOUNTER — Encounter (HOSPITAL_BASED_OUTPATIENT_CLINIC_OR_DEPARTMENT_OTHER): Payer: Self-pay

## 2017-12-30 DIAGNOSIS — Z91040 Latex allergy status: Secondary | ICD-10-CM | POA: Diagnosis not present

## 2017-12-30 DIAGNOSIS — N12 Tubulo-interstitial nephritis, not specified as acute or chronic: Secondary | ICD-10-CM | POA: Insufficient documentation

## 2017-12-30 DIAGNOSIS — R1084 Generalized abdominal pain: Secondary | ICD-10-CM | POA: Diagnosis present

## 2017-12-30 DIAGNOSIS — J45909 Unspecified asthma, uncomplicated: Secondary | ICD-10-CM | POA: Insufficient documentation

## 2017-12-30 DIAGNOSIS — R509 Fever, unspecified: Secondary | ICD-10-CM

## 2017-12-30 DIAGNOSIS — Z79899 Other long term (current) drug therapy: Secondary | ICD-10-CM | POA: Insufficient documentation

## 2017-12-30 LAB — COMPREHENSIVE METABOLIC PANEL
ALBUMIN: 3.6 g/dL (ref 3.5–5.0)
ALT: 18 U/L (ref 0–44)
AST: 18 U/L (ref 15–41)
Alkaline Phosphatase: 54 U/L (ref 38–126)
Anion gap: 11 (ref 5–15)
BILIRUBIN TOTAL: 0.4 mg/dL (ref 0.3–1.2)
BUN: 8 mg/dL (ref 6–20)
CO2: 25 mmol/L (ref 22–32)
Calcium: 8.7 mg/dL — ABNORMAL LOW (ref 8.9–10.3)
Chloride: 101 mmol/L (ref 98–111)
Creatinine, Ser: 0.89 mg/dL (ref 0.44–1.00)
GLUCOSE: 90 mg/dL (ref 70–99)
POTASSIUM: 3.6 mmol/L (ref 3.5–5.1)
Sodium: 137 mmol/L (ref 135–145)
TOTAL PROTEIN: 7.2 g/dL (ref 6.5–8.1)

## 2017-12-30 LAB — URINALYSIS, ROUTINE W REFLEX MICROSCOPIC
Bilirubin Urine: NEGATIVE
Glucose, UA: NEGATIVE mg/dL
Ketones, ur: NEGATIVE mg/dL
NITRITE: NEGATIVE
PH: 6 (ref 5.0–8.0)
Protein, ur: 30 mg/dL — AB
SPECIFIC GRAVITY, URINE: 1.02 (ref 1.005–1.030)

## 2017-12-30 LAB — CBC
HEMATOCRIT: 38.5 % (ref 36.0–46.0)
HEMOGLOBIN: 13.3 g/dL (ref 12.0–15.0)
MCH: 29.6 pg (ref 26.0–34.0)
MCHC: 34.5 g/dL (ref 30.0–36.0)
MCV: 85.6 fL (ref 78.0–100.0)
Platelets: 273 10*3/uL (ref 150–400)
RBC: 4.5 MIL/uL (ref 3.87–5.11)
RDW: 12.9 % (ref 11.5–15.5)
WBC: 13 10*3/uL — AB (ref 4.0–10.5)

## 2017-12-30 LAB — URINALYSIS, MICROSCOPIC (REFLEX): WBC, UA: >50 WBC/hpf (ref 0–5)

## 2017-12-30 LAB — PREGNANCY, URINE: PREG TEST UR: NEGATIVE

## 2017-12-30 LAB — LIPASE, BLOOD: Lipase: 22 U/L (ref 11–51)

## 2017-12-30 MED ORDER — SODIUM CHLORIDE 0.9 % IV BOLUS
1000.0000 mL | Freq: Once | INTRAVENOUS | Status: AC
  Administered 2017-12-30: 1000 mL via INTRAVENOUS

## 2017-12-30 MED ORDER — GI COCKTAIL ~~LOC~~
30.0000 mL | Freq: Once | ORAL | Status: AC
  Administered 2017-12-30: 30 mL via ORAL
  Filled 2017-12-30: qty 30

## 2017-12-30 MED ORDER — IBUPROFEN 400 MG PO TABS
400.0000 mg | ORAL_TABLET | Freq: Once | ORAL | Status: AC
  Administered 2017-12-30: 400 mg via ORAL
  Filled 2017-12-30: qty 1

## 2017-12-30 MED ORDER — HYDROCODONE-ACETAMINOPHEN 5-325 MG PO TABS: 1.0000 | ORAL_TABLET | Freq: Four times a day (QID) | ORAL | 0 refills | Status: DC | PRN

## 2017-12-30 MED ORDER — ONDANSETRON HCL 4 MG/2ML IJ SOLN
4.0000 mg | Freq: Once | INTRAMUSCULAR | Status: AC
  Administered 2017-12-30: 4 mg via INTRAVENOUS
  Filled 2017-12-30: qty 2

## 2017-12-30 MED ORDER — SODIUM CHLORIDE 0.9 % IV SOLN
INTRAVENOUS | Status: DC
  Administered 2017-12-30: 21:00:00 via INTRAVENOUS

## 2017-12-30 MED ORDER — ACETAMINOPHEN 325 MG PO TABS
650.0000 mg | ORAL_TABLET | Freq: Once | ORAL | Status: AC
  Administered 2017-12-30: 650 mg via ORAL
  Filled 2017-12-30: qty 2

## 2017-12-30 MED ORDER — CEPHALEXIN 500 MG PO CAPS: 500.0000 mg | ORAL_CAPSULE | Freq: Four times a day (QID) | ORAL | 0 refills | Status: DC

## 2017-12-30 MED ORDER — IOPAMIDOL (ISOVUE-300) INJECTION 61%
100.0000 mL | Freq: Once | INTRAVENOUS | Status: AC | PRN
  Administered 2017-12-30: 100 mL via INTRAVENOUS

## 2017-12-30 MED ORDER — CEPHALEXIN 250 MG PO CAPS
500.0000 mg | ORAL_CAPSULE | Freq: Once | ORAL | Status: AC
  Administered 2017-12-30: 500 mg via ORAL
  Filled 2017-12-30: qty 2

## 2017-12-30 NOTE — ED Provider Notes (Signed)
[facility] EMERGENCY DEPARTMENT Provider Note   CSN: [id number] Arrival date & time: 12/30/17  1937     History   Chief Complaint Chief Complaint  Patient presents with  . Abdominal Pain    HPI Melaysia Streed is a 32 y.o. female.  Patient is a 32 year old female with a history of morbid obesity, asthma, prior bariatric surgery with sleeve placed in 2017 and ongoing GERD presenting today with abdominal pain that started last night.  Patient states she was unable to sleep last night because she was uncomfortable but the more severe pain started after she woke up today.  She describes the pain is in the epigastric area but then radiates throughout her abdomen.  She had a small piece of pizza earlier today which made the pain worse.  She has had on going intermittent nausea but no vomiting.  She has not had any diarrhea.  She denies any urinary symptoms.  She is felt cold today but has not checked to see if she has a fever.  She did have a upper GI done on Thursday because she has ongoing issues with GERD but denies any pain like she is experiencing now.  She had no prior history of complications from her sleeve.  She states her abdomen even hurts to take a deep breath she denies feeling short of breath or having chest pain.  The history is provided by the patient.  Abdominal Pain   This is a new problem. Episode onset: last night. The problem occurs constantly. The problem has been gradually worsening. The pain is associated with an unknown factor. The pain is located in the generalized abdominal region (starts in epigastric region but radiates throughout the abd). The quality of the pain is aching, cramping and colicky. The pain is at a severity of 7/10. The pain is moderate. Associated symptoms include anorexia and nausea. Pertinent negatives include fever, diarrhea, vomiting, constipation, dysuria, frequency and headaches. The symptoms are aggravated by eating and palpation.  Nothing relieves the symptoms. Past workup includes surgery. Past medical history comments: Cholecystectomy, bariatric surgery with sleeve placement.    Past Medical History:  Diagnosis Date  . Asthma     There are no active problems to display for this patient.   Past Surgical History:  Procedure Laterality Date  . BARIATRIC SURGERY    . CESAREAN SECTION    . CHOLECYSTECTOMY       OB History    Gravida  2   Para      Term      Preterm      AB      Living  1     SAB      TAB      Ectopic      Multiple      Live Births               Home Medications    Prior to Admission medications   Medication Sig Start Date End Date Taking? Authorizing Provider  fluconazole (DIFLUCAN) 50 MG tablet Take 50 mg by mouth daily.   Yes [provider]  PANTOPRAZOLE SODIUM PO Take by mouth.   Yes [provider]  TRAZODONE HCL PO Take by mouth.   Yes [provider]    Family History Family History  Problem Relation Age of Onset  . Alcohol abuse Neg Hx   . Arthritis Neg Hx   . Asthma Neg Hx   . Birth defects Neg Hx   .  Cancer Neg Hx   . COPD Neg Hx   . Depression Neg Hx   . Diabetes Neg Hx   . Drug abuse Neg Hx   . Early death Neg Hx   . Hearing loss Neg Hx   . Heart disease Neg Hx   . Hyperlipidemia Neg Hx   . Hypertension Neg Hx   . Kidney disease Neg Hx   . Learning disabilities Neg Hx   . Mental illness Neg Hx   . Mental retardation Neg Hx   . Miscarriages / Stillbirths Neg Hx   . Stroke Neg Hx   . Vision loss Neg Hx   . Varicose Veins Neg Hx     Social History Social History   Tobacco Use  . Smoking status: Never Smoker  . Smokeless tobacco: Never Used  Substance Use Topics  . Alcohol use: Yes    Comment: occ  . Drug use: No     Allergies   Darvocet [propoxyphene n-acetaminophen] and Latex   Review of Systems Review of Systems  Constitutional: Negative for fever.  Gastrointestinal: Positive for abdominal  pain, anorexia and nausea. Negative for constipation, diarrhea and vomiting.  Genitourinary: Negative for dysuria and frequency.  Neurological: Negative for headaches.  All other systems reviewed and are negative.    Physical Exam Updated Vital Signs BP (!) 138/100   Pulse (!) 114   Temp 98.5 F (36.9 C) (Oral)   Resp 20   Ht 5\' 2"  (1.575 m)   Wt (!) 149.7 kg   LMP 10/30/2017   SpO2 99%   BMI 60.36 kg/m   Physical Exam  Constitutional: She is oriented to person, place, and time. She appears well-developed and well-nourished. No distress.  Appears uncomfortable  HENT:  Head: Normocephalic and atraumatic.  Mouth/Throat: Oropharynx is clear and moist.  Eyes: Pupils are equal, round, and reactive to light. Conjunctivae and EOM are normal.  Neck: Normal range of motion. Neck supple.  Cardiovascular: Regular rhythm and intact distal pulses. Tachycardia present.  No murmur heard. Pulmonary/Chest: Effort normal and breath sounds normal. No respiratory distress. She has no wheezes. She has no rales.  Abdominal: Soft. She exhibits no distension. There is generalized tenderness. There is no rebound, no guarding and no CVA tenderness.  Musculoskeletal: Normal range of motion. She exhibits no edema or tenderness.  Neurological: She is alert and oriented to person, place, and time.  Skin: Skin is warm and dry. No rash noted. No erythema.  Psychiatric: She has a normal mood and affect. Her behavior is normal.  Nursing note and vitals reviewed.    ED Treatments / Results  Labs (all labs ordered are listed, but only abnormal results are displayed) Labs Reviewed  COMPREHENSIVE METABOLIC PANEL - Abnormal; Notable for the following components:      Result Value   Calcium 8.7 (*)    All other components within normal limits  CBC - Abnormal; Notable for the following components:   WBC 13.0 (*)    All other components within normal limits  URINALYSIS, ROUTINE W REFLEX MICROSCOPIC -  Abnormal; Notable for the following components:   APPearance CLOUDY (*)    Hgb urine dipstick LARGE (*)    Protein, ur 30 (*)    Leukocytes, UA LARGE (*)    All other components within normal limits  URINALYSIS, MICROSCOPIC (REFLEX) - Abnormal; Notable for the following components:   Bacteria, UA MANY (*)    All other components within normal limits  URINE CULTURE  LIPASE, BLOOD  PREGNANCY, URINE    EKG None  Radiology Ct Abdomen Pelvis W Contrast  Result Date: 12/30/2017 CLINICAL DATA:  Generalized abdominal pain EXAM: CT ABDOMEN AND PELVIS WITH CONTRAST TECHNIQUE: Multidetector CT imaging of the abdomen and pelvis was performed using the standard protocol following bolus administration of intravenous contrast. CONTRAST:  176mL ISOVUE-300 IOPAMIDOL (ISOVUE-300) INJECTION 61% COMPARISON:  12/21/2016 FINDINGS: Lower chest: Lung bases demonstrate no acute consolidation or pleural effusion. Heart size within normal limits. Hepatobiliary: No focal liver abnormality is seen. Status post cholecystectomy. No biliary dilatation. Pancreas: Unremarkable. No pancreatic ductal dilatation or surrounding inflammatory changes. Spleen: Normal in size without focal abnormality. Adrenals/Urinary Tract: Adrenal glands are unremarkable. Kidneys are normal, without renal calculi, focal lesion, or hydronephrosis. Bladder is unremarkable. Stomach/Bowel: Status post partial gastrectomy. No dilated small bowel. Negative appendix. Residual contrast within the bowel. No bowel wall thickening. Vascular/Lymphatic: No significant vascular findings are present. No enlarged abdominal or pelvic lymph nodes. Reproductive: Intrauterine device in the uterus. 2.7 cm left adnexal cyst. Other: Negative for free air or free fluid. Small infraumbilical ventral hernia containing fat. Musculoskeletal: No acute or significant osseous findings. IMPRESSION: 1. No CT evidence for acute intra-abdominal or pelvic abnormality. 2. Postsurgical  changes of the stomach. 3. 2.7 cm left adnexal cyst 4. Infraumbilical ventral hernia containing fat Electronically Signed   By: Donavan Foil M.D.   On: 12/30/2017 21:55    Procedures Procedures (including critical care time)  Medications Ordered in ED Medications  acetaminophen (TYLENOL) tablet 650 mg (has no administration in time range)  ondansetron (ZOFRAN) injection 4 mg (has no administration in time range)  sodium chloride 0.9 % bolus 1,000 mL (has no administration in time range)  0.9 %  sodium chloride infusion (has no administration in time range)     Initial Impression / Assessment and Plan / ED Course  I have reviewed the triage vital signs and the nursing notes.  Pertinent labs & imaging results that were available during my care of the patient were reviewed by me and considered in my medical decision making (see chart for details).    Pt presenting today with diffuse abdominal pain that started last night and has persisted.  Patient has diffuse abdominal pain on exam and history of a gastric sleeve.  She does have a history of GERD but states symptoms today do not feel anything like that.  She had an upper GI done on Thursday to do further evaluation of GERD and possible revision to improve her GERD.  She denies any bad food exposure, recent travel or antibiotic use.  She did take Diflucan this week for a yeast infection of her skin but denies any urinary or vaginal symptoms.  Lower suspicion for UTI, kidney stone or pelvic infection.  Low suspicion for pregnancy.  Concern for possible complication from her sleeve versus obstruction.  Patient offered pain medication but she wants to try Tylenol because she drove herself here.  CBC, CMP, lipase, UA, UPT, CT abdomen and pelvis pending.  Patient given Zofran and IV fluids.  11:11 PM Patient symptoms did not improve with a GI cocktail or Tylenol.  She still describes as diffuse abdominal pain.  Patient CBC with mild leukocytosis of  13,000, CMP without acute findings, lipase within normal limits, UPT is negative and UA is concerning for a UTI with large leukocytes, large hemoglobin and greater than 50 white blood cells with many bacteria.  There is some contamination and a culture was sent.  On repeat check patient was tachycardic in the 120s and had a fever of 100.6.  Concerned that she may be developing pyelonephritis.  CT is negative for acute findings.  Patient will be treated with Keflex.  Offered her IV pain medication such as morphine and IV antibiotics but she is requesting to go home and wants to just try a pill.  Does take Carafate at home but was taken off of PPI by her GI doctor.  She also does admit to taking [employer] powders occasionally for the pain.    Final Clinical Impressions(s) / ED Diagnoses   Final diagnoses:  Generalized abdominal pain  Fever, unspecified fever cause  Pyelonephritis    ED Discharge Orders         Ordered    HYDROcodone-acetaminophen (NORCO/VICODIN) 5-325 MG tablet  Every 6 hours PRN     12/30/17 2316    cephALEXin (KEFLEX) 500 MG capsule  4 times daily     12/30/17 2318           Blanchie Dessert, MD 12/30/17 2318

## 2017-12-30 NOTE — ED Triage Notes (Signed)
Pt presents with generalized abdominal pain- pt is to uncomfortable to sit, feels better bent over. Reports nausea, denies V/D, fevers.

## 2017-12-30 NOTE — Discharge Instructions (Signed)
Please return to the emergency room if your symptoms worsen.  The pain becomes worse you start vomiting or have a persistent high fever.

## 2018-01-01 LAB — URINE CULTURE: Culture: 60000 — AB

## 2018-01-02 ENCOUNTER — Telehealth: Payer: Self-pay | Admitting: Emergency Medicine

## 2018-01-02 NOTE — Telephone Encounter (Signed)
Post ED Visit - Positive Culture Follow-up  Culture report reviewed by antimicrobial stewardship pharmacist:  []  Elenor Quinones, Pharm.D. []  Heide Guile, Pharm.D., BCPS AQ-ID []  [place], Pharm.D., BCPS []  Alycia Rossetti, Pharm.D., BCPS []  [place], Pharm.D., BCPS, AAHIVP []  Legrand Como, Pharm.D., BCPS, AAHIVP [x]  Salome Arnt, PharmD, BCPS []  Johnnette Gourd, PharmD, BCPS []  Hughes Better, PharmD, BCPS []  Leeroy Cha, PharmD  Positive urine culture Treated with fluconazole and cephalexin, organism sensitive to the same and no further patient follow-up is required at this time.  Hazle Nordmann 01/02/2018, 10:23 AM

## 2018-07-20 ENCOUNTER — Encounter (HOSPITAL_COMMUNITY): Payer: Self-pay | Admitting: *Deleted

## 2018-07-20 ENCOUNTER — Inpatient Hospital Stay (HOSPITAL_COMMUNITY): Payer: BLUE CROSS/BLUE SHIELD

## 2018-07-20 ENCOUNTER — Inpatient Hospital Stay (HOSPITAL_COMMUNITY)
Admission: AD | Admit: 2018-07-20 | Discharge: 2018-07-20 | Disposition: A | Discharge status: Home / Self Care | Payer: BLUE CROSS/BLUE SHIELD | Attending: Obstetrics & Gynecology | Admitting: Obstetrics & Gynecology

## 2018-07-20 DIAGNOSIS — O26891 Other specified pregnancy related conditions, first trimester: Secondary | ICD-10-CM | POA: Diagnosis not present

## 2018-07-20 DIAGNOSIS — Z3A01 Less than 8 weeks gestation of pregnancy: Secondary | ICD-10-CM | POA: Insufficient documentation

## 2018-07-20 DIAGNOSIS — R109 Unspecified abdominal pain: Secondary | ICD-10-CM | POA: Diagnosis not present

## 2018-07-20 DIAGNOSIS — R102 Pelvic and perineal pain: Secondary | ICD-10-CM | POA: Insufficient documentation

## 2018-07-20 DIAGNOSIS — O3680X0 Pregnancy with inconclusive fetal viability, not applicable or unspecified: Secondary | ICD-10-CM

## 2018-07-20 DIAGNOSIS — O26899 Other specified pregnancy related conditions, unspecified trimester: Secondary | ICD-10-CM

## 2018-07-20 LAB — WET PREP, GENITAL
CLUE CELLS WET PREP: NONE SEEN
SPERM: NONE SEEN
Trich, Wet Prep: NONE SEEN
WBC WET PREP: NONE SEEN
Yeast Wet Prep HPF POC: NONE SEEN

## 2018-07-20 LAB — COMPREHENSIVE METABOLIC PANEL
ALBUMIN: 3.5 g/dL (ref 3.5–5.0)
ALT: 31 U/L (ref 0–44)
AST: 24 U/L (ref 15–41)
Alkaline Phosphatase: 52 U/L (ref 38–126)
Anion gap: 9 (ref 5–15)
BILIRUBIN TOTAL: 0.5 mg/dL (ref 0.3–1.2)
BUN: 12 mg/dL (ref 6–20)
CALCIUM: 8.8 mg/dL — AB (ref 8.9–10.3)
CO2: 20 mmol/L — ABNORMAL LOW (ref 22–32)
CREATININE: 0.8 mg/dL (ref 0.44–1.00)
Chloride: 107 mmol/L (ref 98–111)
GFR calc Af Amer: >60 mL/min (ref >60)
GLUCOSE: 93 mg/dL (ref 70–99)
Potassium: 4 mmol/L (ref 3.5–5.1)
SODIUM: 136 mmol/L (ref 135–145)
TOTAL PROTEIN: 6.8 g/dL (ref 6.5–8.1)

## 2018-07-20 LAB — CBC WITH DIFFERENTIAL/PLATELET
Abs Immature Granulocytes: 0.02 10*3/uL (ref 0.00–0.07)
BASOS ABS: 0 10*3/uL (ref 0.0–0.1)
Basophils Relative: 0 %
EOS ABS: 0.2 10*3/uL (ref 0.0–0.5)
EOS PCT: 3 %
HEMATOCRIT: 38.5 % (ref 36.0–46.0)
Hemoglobin: 13.1 g/dL (ref 12.0–15.0)
Immature Granulocytes: 0 %
LYMPHS ABS: 2.9 10*3/uL (ref 0.7–4.0)
Lymphocytes Relative: 42 %
MCH: 29.6 pg (ref 26.0–34.0)
MCHC: 34 g/dL (ref 30.0–36.0)
MCV: 86.9 fL (ref 80.0–100.0)
MONO ABS: 0.5 10*3/uL (ref 0.1–1.0)
Monocytes Relative: 7 %
NRBC: 0 % (ref 0.0–0.2)
Neutro Abs: 3.2 10*3/uL (ref 1.7–7.7)
Neutrophils Relative %: 48 %
Platelets: 289 10*3/uL (ref 150–400)
RBC: 4.43 MIL/uL (ref 3.87–5.11)
RDW: 13.2 % (ref 11.5–15.5)
WBC: 6.8 10*3/uL (ref 4.0–10.5)

## 2018-07-20 LAB — URINALYSIS, ROUTINE W REFLEX MICROSCOPIC
BILIRUBIN URINE: NEGATIVE
GLUCOSE, UA: NEGATIVE mg/dL
Hgb urine dipstick: NEGATIVE
KETONES UR: NEGATIVE mg/dL
LEUKOCYTE UA: NEGATIVE
Nitrite: NEGATIVE
PROTEIN: NEGATIVE mg/dL
Specific Gravity, Urine: 1.02 (ref 1.005–1.030)
pH: 5 (ref 5.0–8.0)

## 2018-07-20 LAB — HCG, QUANTITATIVE, PREGNANCY: HCG, BETA CHAIN, QUANT, S: 94 m[IU]/mL — AB (ref <5)

## 2018-07-20 LAB — POCT PREGNANCY, URINE: Preg Test, Ur: POSITIVE — AB

## 2018-07-20 NOTE — Discharge Instructions (Signed)
Abdominal Pain During Pregnancy ° °Abdominal pain is common during pregnancy, and has many possible causes. Some causes are more serious than others, and sometimes the cause is not known. Abdominal pain can be a sign that labor is starting. It can also be caused by normal growth and stretching of muscles and ligaments during pregnancy. Always tell your health care provider if you have any abdominal pain. °Follow these instructions at home: °· Do not have sex or put anything in your vagina until your pain goes away completely. °· Get plenty of rest until your pain improves. °· Drink enough fluid to keep your urine pale yellow. °· Take over-the-counter and prescription medicines only as told by your health care provider. °· Keep all follow-up visits as told by your health care provider. This is important. °Contact a health care provider if: °· Your pain continues or gets worse after resting. °· You have lower abdominal pain that: °? Comes and goes at regular intervals. °? Spreads to your back. °? Is similar to menstrual cramps. °· You have pain or burning when you urinate. °Get help right away if: °· You have a fever or chills. °· You have vaginal bleeding. °· You are leaking fluid from your vagina. °· You are passing tissue from your vagina. °· You have vomiting or diarrhea that lasts for more than 24 hours. °· Your baby is moving less than usual. °· You feel very weak or faint. °· You have shortness of breath. °· You develop severe pain in your upper abdomen. °Summary °· Abdominal pain is common during pregnancy, and has many possible causes. °· If you experience abdominal pain during pregnancy, tell your health care provider right away. °· Follow your health care provider's home care instructions and keep all follow-up visits as directed. °This information is not intended to replace advice given to you by your health care provider. Make sure you discuss any questions you have with your health care  provider. °Document Released: 04/25/2005 Document Revised: 07/28/2016 Document Reviewed: 07/28/2016 °Elsevier Interactive Patient Education © 2019 [employer] ° °

## 2018-07-20 NOTE — MAU Note (Signed)
Pt C/O pelvic & back pain x 2 days, denies dysuria.  No bleeding or discharge.

## 2018-07-20 NOTE — MAU Provider Note (Signed)
History     CSN: [id number]  Arrival date and time: 07/20/18 1718   First Provider Initiated Contact with Patient 07/20/18 1813      Chief Complaint  Patient presents with  . Pelvic Pain  . Back Pain   HPI   Alexis Blake is a 33 y.o. [id number] @ [redacted]w[redacted]d here with pelvic pain that started 2 days ago. The pain radiates around to her lower back. The pain is in both sides of her lower abdomen. She has tried using a heating pad which is not helping the pain. She did not know she was pregnant. No bleeding.   OB History    Gravida  3   Para  2   Term  2   Preterm      AB      Living  2     SAB      TAB      Ectopic      Multiple      Live Births  2           Past Medical History:  Diagnosis Date  . Asthma     Past Surgical History:  Procedure Laterality Date  . BARIATRIC SURGERY    . CESAREAN SECTION    . CHOLECYSTECTOMY      Family History  Problem Relation Age of Onset  . Alcohol abuse Neg Hx   . Arthritis Neg Hx   . Asthma Neg Hx   . Birth defects Neg Hx   . Cancer Neg Hx   . COPD Neg Hx   . Depression Neg Hx   . Diabetes Neg Hx   . Drug abuse Neg Hx   . Early death Neg Hx   . Hearing loss Neg Hx   . Heart disease Neg Hx   . Hyperlipidemia Neg Hx   . Hypertension Neg Hx   . Kidney disease Neg Hx   . Learning disabilities Neg Hx   . Mental illness Neg Hx   . Mental retardation Neg Hx   . Miscarriages / Stillbirths Neg Hx   . Stroke Neg Hx   . Vision loss Neg Hx   . Varicose Veins Neg Hx     Social History   Tobacco Use  . Smoking status: Never Smoker  . Smokeless tobacco: Never Used  Substance Use Topics  . Alcohol use: Yes    Alcohol/week: 2.0 standard drinks    Types: 2 Standard drinks or equivalent per week    Comment: occ  . Drug use: No    Allergies:  Allergies  Allergen Reactions  . Darvocet [Propoxyphene N-Acetaminophen] Anaphylaxis  . Latex Anaphylaxis    Medications Prior to Admission  Medication Sig  Dispense Refill Last Dose  . albuterol (PROVENTIL HFA;VENTOLIN HFA) 108 (90 Base) MCG/ACT inhaler Inhale 2 puffs into the lungs every 6 (six) hours as needed for wheezing or shortness of breath.     . cephALEXin (KEFLEX) 500 MG capsule Take 1 capsule (500 mg total) by mouth 4 (four) times daily. 28 capsule 0   . fluconazole (DIFLUCAN) 50 MG tablet Take 50 mg by mouth daily.     Marland Kitchen HYDROcodone-acetaminophen (NORCO/VICODIN) 5-325 MG tablet Take 1-2 tablets by mouth every 6 (six) hours as needed. 10 tablet 0   . PANTOPRAZOLE SODIUM PO Take by mouth.     . TRAZODONE HCL PO Take by mouth.      Results for orders placed or performed during the hospital encounter of 07/20/18 (  from the past 48 hour(s))  Pregnancy, urine POC     Status: Abnormal   Collection Time: 07/20/18  5:52 PM  Result Value Ref Range   Preg Test, Ur POSITIVE (A) NEGATIVE    Comment:        THE SENSITIVITY OF THIS METHODOLOGY IS >24 mIU/mL   Urinalysis, Routine w reflex microscopic     Status: Abnormal   Collection Time: 07/20/18  5:56 PM  Result Value Ref Range   Color, Urine YELLOW YELLOW   APPearance CLOUDY (A) CLEAR   Specific Gravity, Urine 1.020 1.005 - 1.030   pH 5.0 5.0 - 8.0   Glucose, UA NEGATIVE NEGATIVE mg/dL   Hgb urine dipstick NEGATIVE NEGATIVE   Bilirubin Urine NEGATIVE NEGATIVE   Ketones, ur NEGATIVE NEGATIVE mg/dL   Protein, ur NEGATIVE NEGATIVE mg/dL   Nitrite NEGATIVE NEGATIVE   Leukocytes,Ua NEGATIVE NEGATIVE    Comment: Performed at [facility], [address] [place], [place] [address]  Wet prep, genital     Status: None   Collection Time: 07/20/18  6:30 PM  Result Value Ref Range   Yeast Wet Prep HPF POC NONE SEEN NONE SEEN   Trich, Wet Prep NONE SEEN NONE SEEN   Clue Cells Wet Prep HPF POC NONE SEEN NONE SEEN   WBC, Wet Prep HPF POC NONE SEEN NONE SEEN    Comment: BACTERIA- TOO NUMEROUS TO COUNT   Sperm NONE SEEN     Comment: Performed at [facility], [place] [address]  [place], [place] [address]  CBC with Differential/Platelet     Status: None   Collection Time: 07/20/18  6:32 PM  Result Value Ref Range   WBC 6.8 4.0 - 10.5 K/uL   RBC 4.43 3.87 - 5.11 MIL/uL   Hemoglobin 13.1 12.0 - 15.0 g/dL   HCT 38.5 36.0 - 46.0 %   MCV 86.9 80.0 - 100.0 fL   MCH 29.6 26.0 - 34.0 pg   MCHC 34.0 30.0 - 36.0 g/dL   RDW 13.2 11.5 - 15.5 %   Platelets 289 150 - 400 K/uL   nRBC 0.0 0.0 - 0.2 %   Neutrophils Relative % 48 %   Neutro Abs 3.2 1.7 - 7.7 K/uL   Lymphocytes Relative 42 %   Lymphs Abs 2.9 0.7 - 4.0 K/uL   Monocytes Relative 7 %   Monocytes Absolute 0.5 0.1 - 1.0 K/uL   Eosinophils Relative 3 %   Eosinophils Absolute 0.2 0.0 - 0.5 K/uL   Basophils Relative 0 %   Basophils Absolute 0.0 0.0 - 0.1 K/uL   Immature Granulocytes 0 %   Abs Immature Granulocytes 0.02 0.00 - 0.07 K/uL    Comment: Performed at [facility], [address], [place], [place] [address]  hCG, quantitative, pregnancy     Status: Abnormal   Collection Time: 07/20/18  6:32 PM  Result Value Ref Range   hCG, Beta Chain, Quant, S 94 (H) <5 mIU/mL    Comment:          GEST. AGE      CONC.  (mIU/mL)   <=1 WEEK        5 - 50     2 WEEKS       50 - 500     3 WEEKS       100 - 10,000     4 WEEKS     1,000 - 30,000     5  WEEKS     3,500 - 115,000   6-8 WEEKS     12,000 - 270,000    12 WEEKS     15,000 - 220,000        FEMALE AND NON-PREGNANT FEMALE:     LESS THAN 5 mIU/mL Performed at [facility], [place] [address], [place], [place] [address]   Comprehensive metabolic panel     Status: Abnormal   Collection Time: 07/20/18  6:32 PM  Result Value Ref Range   Sodium 136 135 - 145 mmol/L   Potassium 4.0 3.5 - 5.1 mmol/L   Chloride 107 98 - 111 mmol/L   CO2 20 (L) 22 - 32 mmol/L   Glucose, Bld 93 70 - 99 mg/dL   BUN 12 6 - 20 mg/dL   Creatinine, Ser 0.80 0.44 - 1.00 mg/dL   Calcium 8.8 (L) 8.9 - 10.3 mg/dL   Total Protein 6.8 6.5 - 8.1 g/dL   Albumin 3.5 3.5 - 5.0 g/dL   AST 24  15 - 41 U/L   ALT 31 0 - 44 U/L   Alkaline Phosphatase 52 38 - 126 U/L   Total Bilirubin 0.5 0.3 - 1.2 mg/dL   GFR calc non Af Amer >60 >60 mL/min   GFR calc Af Amer >60 >60 mL/min   Anion gap 9 5 - 15    Comment: Performed at [facility] [address], [place], [place] [address]  ABO/Rh     Status: None   Collection Time: 07/20/18  7:01 PM  Result Value Ref Range   ABO/RH(D)      B POS Performed at [facility] [address], [place],  [address]    US Ob Less Than 14 Weeks With Ob Transvaginal  Result Date: 07/20/2018 CLINICAL DATA:  Pelvic pain.  Positive urine pregnancy test EXAM: OBSTETRIC <14 WK [place] AND TRANSVAGINAL OB US TECHNIQUE: Both transabdominal and transvaginal ultrasound examinations were performed for complete evaluation of the gestation as well as the maternal uterus, adnexal regions, and pelvic cul-de-sac. Transvaginal technique was performed to assess early pregnancy. COMPARISON:  None. FINDINGS: Intrauterine gestational sac: Not visualized Yolk sac:  Not visualized Embryo:  Not visualized Cardiac Activity: Not visualized Subchorionic hemorrhage:  None visualized. Maternal uterus/adnexae: Cervical os is closed. Endometrium measures 11 mm in thickness. Right ovary measures 3.5 x 1.9 x 2.1 cm. The left ovary is not well seen. There is no extrauterine pelvic mass evident. No free pelvic fluid. IMPRESSION: No intrauterine gestation evident. Assuming positive serum beta hCG, differential considerations include intrauterine gestation too early to be seen by either transabdominal or transvaginal technique; recent spontaneous abortion; possible ectopic gestation. Close clinical and laboratory surveillance in this regard advised. Timing of repeat ultrasound will in large part depend on beta HCG values going forward. No intrauterine mass or appreciable endometrial thickening. No extrauterine pelvic mass or free pelvic fluid. Note that the left ovary is not well  visualized on this study due to surrounding bowel gas. Electronically Signed   By: Lowella Grip III M.D.   On: 07/20/2018 19:57   Review of Systems  Gastrointestinal: Positive for abdominal pain. Negative for nausea and vomiting.  Genitourinary: Positive for pelvic pain. Negative for vaginal bleeding and vaginal discharge.  Neurological: Positive for headaches.   Physical Exam   Blood pressure 132/84, pulse 78, temperature 98.4 F (36.9 C), temperature source Oral, resp. rate 18, height 5\' 2"  (1.575 m), weight (!) 154.2 kg, last  menstrual period 06/18/2018, unknown if currently breastfeeding.  Physical Exam  Constitutional: She is oriented to person, place, and time. She appears well-developed and well-nourished. No distress.  HENT:  Head: Normocephalic.  Eyes: Pupils are equal, round, and reactive to light.  GI: Normal appearance. There is abdominal tenderness in the suprapubic area. There is no rigidity, no rebound and no guarding.  Musculoskeletal: Normal range of motion.  Neurological: She is alert and oriented to person, place, and time.  Skin: Skin is warm. She is not diaphoretic.  Psychiatric: Her behavior is normal.   MAU Course  Procedures  None  MDM  Wet prep & GC HIV, CBC, Hcg, ABO US OB transvaginal  B positive blood type   Assessment and Plan   A:  1. Pregnancy of unknown anatomic location   2. Abdominal pain in pregnancy, first trimester   3. [redacted] weeks gestation of pregnancy   4. Abdominal pain during pregnancy, antepartum     P:  Discharge home in stable condition Ectopic precautions Return to the [facility] on Monday at 11:00 for repeat stat Quant Return to MAU if symptoms worsen  Pelvic rest    Micki Cassel, Artist Pais, NP 07/20/2018 8:41 PM

## 2018-07-21 LAB — HIV ANTIBODY (ROUTINE TESTING W REFLEX): HIV SCREEN 4TH GENERATION: NONREACTIVE

## 2018-07-23 ENCOUNTER — Ambulatory Visit: Payer: BLUE CROSS/BLUE SHIELD

## 2018-07-23 ENCOUNTER — Telehealth: Payer: Self-pay | Admitting: *Deleted

## 2018-07-23 LAB — ABO/RH: ABO/RH(D): B POS

## 2018-07-23 LAB — GC/CHLAMYDIA PROBE AMP (~~LOC~~) NOT AT ARMC
Chlamydia: NEGATIVE
Neisseria Gonorrhea: NEGATIVE

## 2018-07-23 NOTE — Telephone Encounter (Signed)
Alexis Blake Stat bhcg appointment . I called her and she states she was told she can go to her OB provider. I asked if she had called them and who that was. She states she is going to [facility] on Wednesday. I advised her to call them and see if they can see her sooner as recommended. She denies severe pain or bleeding. She voices understanding.

## 2018-08-15 ENCOUNTER — Other Ambulatory Visit: Payer: Self-pay

## 2018-08-15 ENCOUNTER — Inpatient Hospital Stay (HOSPITAL_COMMUNITY)
Admission: AD | Admit: 2018-08-15 | Discharge: 2018-08-15 | Disposition: A | Discharge status: Home / Self Care | Payer: BLUE CROSS/BLUE SHIELD | Attending: Obstetrics & Gynecology | Admitting: Obstetrics & Gynecology

## 2018-08-15 ENCOUNTER — Inpatient Hospital Stay (HOSPITAL_COMMUNITY): Payer: BLUE CROSS/BLUE SHIELD

## 2018-08-15 ENCOUNTER — Encounter (HOSPITAL_COMMUNITY): Payer: Self-pay | Admitting: *Deleted

## 2018-08-15 DIAGNOSIS — J45909 Unspecified asthma, uncomplicated: Secondary | ICD-10-CM | POA: Insufficient documentation

## 2018-08-15 DIAGNOSIS — O34219 Maternal care for unspecified type scar from previous cesarean delivery: Secondary | ICD-10-CM | POA: Insufficient documentation

## 2018-08-15 DIAGNOSIS — N764 Abscess of vulva: Secondary | ICD-10-CM | POA: Diagnosis present

## 2018-08-15 DIAGNOSIS — O26891 Other specified pregnancy related conditions, first trimester: Secondary | ICD-10-CM | POA: Diagnosis not present

## 2018-08-15 DIAGNOSIS — Z886 Allergy status to analgesic agent status: Secondary | ICD-10-CM | POA: Insufficient documentation

## 2018-08-15 DIAGNOSIS — Z9049 Acquired absence of other specified parts of digestive tract: Secondary | ICD-10-CM | POA: Insufficient documentation

## 2018-08-15 DIAGNOSIS — E669 Obesity, unspecified: Secondary | ICD-10-CM | POA: Insufficient documentation

## 2018-08-15 DIAGNOSIS — O99211 Obesity complicating pregnancy, first trimester: Secondary | ICD-10-CM | POA: Diagnosis not present

## 2018-08-15 DIAGNOSIS — Z3A01 Less than 8 weeks gestation of pregnancy: Secondary | ICD-10-CM | POA: Insufficient documentation

## 2018-08-15 DIAGNOSIS — Z3490 Encounter for supervision of normal pregnancy, unspecified, unspecified trimester: Secondary | ICD-10-CM

## 2018-08-15 DIAGNOSIS — O99511 Diseases of the respiratory system complicating pregnancy, first trimester: Secondary | ICD-10-CM | POA: Diagnosis not present

## 2018-08-15 DIAGNOSIS — O3680X0 Pregnancy with inconclusive fetal viability, not applicable or unspecified: Secondary | ICD-10-CM | POA: Diagnosis not present

## 2018-08-15 DIAGNOSIS — Z91040 Latex allergy status: Secondary | ICD-10-CM | POA: Diagnosis not present

## 2018-08-15 HISTORY — DX: Obesity, unspecified: E66.9

## 2018-08-15 HISTORY — DX: Unspecified abnormal cytological findings in specimens from vagina: R87.629

## 2018-08-15 LAB — URINALYSIS, ROUTINE W REFLEX MICROSCOPIC
Bilirubin Urine: NEGATIVE
Glucose, UA: NEGATIVE mg/dL
Ketones, ur: NEGATIVE mg/dL
Leukocytes,Ua: NEGATIVE
Nitrite: NEGATIVE
Protein, ur: NEGATIVE mg/dL
Specific Gravity, Urine: 1.03 (ref 1.005–1.030)
pH: 5 (ref 5.0–8.0)

## 2018-08-15 LAB — WET PREP, GENITAL
Clue Cells Wet Prep HPF POC: NONE SEEN
Sperm: NONE SEEN
Trich, Wet Prep: NONE SEEN
Yeast Wet Prep HPF POC: NONE SEEN

## 2018-08-15 MED ORDER — CEFADROXIL 500 MG PO CAPS: 500.0000 mg | ORAL_CAPSULE | Freq: Two times a day (BID) | ORAL | 0 refills | Status: AC

## 2018-08-15 NOTE — MAU Note (Signed)
Pt presents to MAU with complaints of pain in her vagina. States it feels like a cyst. Denies any vaginal bleeding.

## 2018-08-15 NOTE — MAU Provider Note (Signed)
History     CSN: [id number]  Arrival date and time: 08/15/18 7829   First Provider Initiated Contact with Patient 08/15/18 1109      Chief Complaint  Patient presents with   Vaginal Pain   Alexis Blake is a 33 y.o. G3P2002 at [redacted]w[redacted]d who presents to MAU for a "knot in the vagina" that appeared two days ago. Pt reports the knot is inside the vagina. Pt reports she felt the knot inside her vagina and it "feels fleshy like a hemorrhoid, but is not where a hemorrhoid should be." Pt reports the knot is causing her pain. Pt describes the pain as a stinging pain when she washes or wipes after urination. Pt rates pain as 7/10. Pt denies anything that relieves the pain. Pt reports she feels the pain all the time. Pt denies trying any treatments at home, but reports she has been "messing with" the knot.  Of note, Alexis Blake was seen in MAU on 07/18/2018 and diagnosed with pregnancy of unknown location. She was advised to f/u with a repeat hCG 2days later, but reports she did not f/u with bloodwork or with a f/u US. Will evaluate with [place] today in MAU.  Pt denies VB, vaginal discharge/odor/itching. Pt denies N/V, abdominal pain, constipation, diarrhea, or urinary problems. Pt denies fever, chills, fatigue, sweating or changes in appetite. Pt denies SOB or chest pain. Pt denies dizziness, HA, light-headedness, weakness.  Problems this pregnancy include: none Allergies? Darvocet, latex Current medications/supplements? PNVs, albuterol Prenatal care provider? [place], next appt 08/20/2018   OB History    Gravida  3   Para  2   Term  2   Preterm      AB      Living  2     SAB      TAB      Ectopic      Multiple      Live Births  2           Past Medical History:  Diagnosis Date   Asthma    Obesity    Vaginal Pap smear, abnormal     Past Surgical History:  Procedure Laterality Date   BARIATRIC SURGERY     CESAREAN SECTION     C/S x 2    CHOLECYSTECTOMY     LEEP      Family History  Problem Relation Age of Onset   Alcohol abuse Neg Hx    Arthritis Neg Hx    Asthma Neg Hx    Birth defects Neg Hx    Cancer Neg Hx    COPD Neg Hx    Depression Neg Hx    Diabetes Neg Hx    Drug abuse Neg Hx    Early death Neg Hx    Hearing loss Neg Hx    Heart disease Neg Hx    Hyperlipidemia Neg Hx    Hypertension Neg Hx    Kidney disease Neg Hx    Learning disabilities Neg Hx    Mental illness Neg Hx    Mental retardation Neg Hx    Miscarriages / Stillbirths Neg Hx    Stroke Neg Hx    Vision loss Neg Hx    Varicose Veins Neg Hx     Social History   Tobacco Use   Smoking status: Never Smoker   Smokeless tobacco: Never Used  Substance Use Topics   Alcohol use: Not Currently    Alcohol/week: 2.0 standard drinks  Types: 2 Standard drinks or equivalent per week    Comment: occ   Drug use: No    Allergies:  Allergies  Allergen Reactions   Darvocet [Propoxyphene N-Acetaminophen] Anaphylaxis   Latex Anaphylaxis    Medications Prior to Admission  Medication Sig Dispense Refill Last Dose   albuterol (PROVENTIL HFA;VENTOLIN HFA) 108 (90 Base) MCG/ACT inhaler Inhale 2 puffs into the lungs every 6 (six) hours as needed for wheezing or shortness of breath.   08/14/2018 at Unknown time   Prenatal Vit-Fe Fumarate-FA (PRENATAL MULTIVITAMIN) TABS tablet Take 1 tablet by mouth daily at 12 noon.   08/14/2018 at Unknown time    Review of Systems  Constitutional: Negative for appetite change, chills, diaphoresis, fatigue and fever.  Respiratory: Negative for shortness of breath.   Cardiovascular: Negative for chest pain.  Gastrointestinal: Negative for abdominal pain, constipation, diarrhea, nausea and vomiting.  Genitourinary: Positive for vaginal pain. Negative for dysuria, flank pain, frequency, pelvic pain, urgency, vaginal bleeding and vaginal discharge.  Neurological: Negative for dizziness,  weakness, light-headedness and headaches.   Physical Exam   Blood pressure 128/87, pulse 73, temperature 98.3 F (36.8 C), resp. rate 16, last menstrual period 06/18/2018, SpO2 100 %, unknown if currently breastfeeding.   Patient Vitals for the past 24 hrs:  BP Temp Pulse Resp SpO2  08/15/18 1040 128/87 -- 73 16 --  08/15/18 1015 (!) 143/94 98.3 F (36.8 C) 73 18 100 %    Physical Exam  Constitutional: She is oriented to person, place, and time. She appears well-developed and well-nourished. No distress.  HENT:  Head: Normocephalic and atraumatic.  Respiratory: Effort normal.  Genitourinary:    There is tenderness and lesion on the right labia. There is no rash on the right labia. There is no rash, tenderness or lesion on the left labia.  Neurological: She is alert and oriented to person, place, and time.  Skin: Skin is warm and dry. She is not diaphoretic.  Psychiatric: She has a normal mood and affect. Her behavior is normal.   Results for orders placed or performed during the hospital encounter of 08/15/18 (from the past 24 hour(s))  Urinalysis, Routine w reflex microscopic     Status: Abnormal   Collection Time: 08/15/18 10:22 AM  Result Value Ref Range   Color, Urine YELLOW YELLOW   APPearance HAZY (A) CLEAR   Specific Gravity, Urine 1.030 1.005 - 1.030   pH 5.0 5.0 - 8.0   Glucose, UA NEGATIVE NEGATIVE mg/dL   Hgb urine dipstick SMALL (A) NEGATIVE   Bilirubin Urine NEGATIVE NEGATIVE   Ketones, ur NEGATIVE NEGATIVE mg/dL   Protein, ur NEGATIVE NEGATIVE mg/dL   Nitrite NEGATIVE NEGATIVE   Leukocytes,Ua NEGATIVE NEGATIVE   RBC / HPF 0-5 0 - 5 RBC/hpf   WBC, UA 0-5 0 - 5 WBC/hpf   Bacteria, UA RARE (A) NONE SEEN   Squamous Epithelial / LPF 11-20 0 - 5   Mucus PRESENT   Wet prep, genital     Status: Abnormal   Collection Time: 08/15/18 11:25 AM  Result Value Ref Range   Yeast Wet Prep HPF POC NONE SEEN NONE SEEN   Trich, Wet Prep NONE SEEN NONE SEEN   Clue Cells  Wet Prep HPF POC NONE SEEN NONE SEEN   WBC, Wet Prep HPF POC MODERATE (A) NONE SEEN   Sperm NONE SEEN    US Ob Less Than 14 Weeks With Ob Transvaginal  Result Date: 08/15/2018 CLINICAL DATA:  Pelvic pain.  1st trimester pregnancy of unknown anatomic location. EXAM: OBSTETRIC <14 WK [place] AND TRANSVAGINAL OB US TECHNIQUE: Both transabdominal and transvaginal ultrasound examinations were performed for complete evaluation of the gestation as well as the maternal uterus, adnexal regions, and pelvic cul-de-sac. Transvaginal technique was performed to assess early pregnancy. COMPARISON:  07/20/2018 FINDINGS: Intrauterine gestational sac: Single Yolk sac:  Not Visualized. Embryo:  Visualized. Cardiac Activity: Visualized. Heart Rate: 146 bpm CRL:  12.6 mm   7 w   3 d                  [place] EDC: 03/31/2019 Subchorionic hemorrhage:  None visualized. Maternal uterus/adnexae: Neither ovary is directly visualized on this study, however no adnexal mass or abnormal free fluid identified. IMPRESSION: Single living IUP measuring 7 weeks 3 days, with [place] EDC of 03/31/2019. Electronically Signed   By: Earle Gell M.D.   On: 08/15/2018 12:23   US Ob Less Than 14 Weeks With Ob Transvaginal  Result Date: 07/20/2018 CLINICAL DATA:  Pelvic pain.  Positive urine pregnancy test EXAM: OBSTETRIC <14 WK [place] AND TRANSVAGINAL OB US TECHNIQUE: Both transabdominal and transvaginal ultrasound examinations were performed for complete evaluation of the gestation as well as the maternal uterus, adnexal regions, and pelvic cul-de-sac. Transvaginal technique was performed to assess early pregnancy. COMPARISON:  None. FINDINGS: Intrauterine gestational sac: Not visualized Yolk sac:  Not visualized Embryo:  Not visualized Cardiac Activity: Not visualized Subchorionic hemorrhage:  None visualized. Maternal uterus/adnexae: Cervical os is closed. Endometrium measures 11 mm in thickness. Right ovary measures 3.5 x 1.9 x 2.1 cm. The left ovary is not well  seen. There is no extrauterine pelvic mass evident. No free pelvic fluid. IMPRESSION: No intrauterine gestation evident. Assuming positive serum beta hCG, differential considerations include intrauterine gestation too early to be seen by either transabdominal or transvaginal technique; recent spontaneous abortion; possible ectopic gestation. Close clinical and laboratory surveillance in this regard advised. Timing of repeat ultrasound will in large part depend on beta HCG values going forward. No intrauterine mass or appreciable endometrial thickening. No extrauterine pelvic mass or free pelvic fluid. Note that the left ovary is not well visualized on this study due to surrounding bowel gas. Electronically Signed   By: Lowella Grip III M.D.   On: 07/20/2018 19:57    MAU Course  Procedures  MDM -vulvar abscess and pregnancy of unknown location -UA: small hgb/hazy/rare bacteria, otherwise WNL, sending for culture -WetPrep: moderate WBCs, otherwise WNL -GC/CT performed -will advise sitz baths, warm compresses, and RX for ABX for abcess -[place]: single IUP [redacted]w[redacted]d -pt discharged to home in stable condition  Orders Placed This Encounter  Procedures   Wet prep, genital    Standing Status:   Standing    Number of Occurrences:   1   Culture, OB Urine    Standing Status:   Standing    Number of Occurrences:   1   US OB LESS THAN 14 WEEKS WITH OB TRANSVAGINAL    Standing Status:   Standing    Number of Occurrences:   1    Order Specific Question:   Symptom/Reason for Exam    Answer:   Pregnancy of unknown anatomic location [329518]   Urinalysis, Routine w reflex microscopic    Standing Status:   Standing    Number of Occurrences:   1   Discharge patient    Order Specific Question:   Discharge disposition    Answer:   01-Home or Self Care [  1]    Order Specific Question:   Discharge patient date    Answer:   08/15/2018   Meds ordered this encounter  Medications   cefadroxil (DURICEF) 500  MG capsule    Sig: Take 1 capsule (500 mg total) by mouth 2 (two) times daily for 7 days.    Dispense:  14 capsule    Refill:  0    Order Specific Question:   Supervising Provider    Answer:   Verita Schneiders A [[id number]]   Assessment and Plan   1. Vulvar abscess   2. Pregnancy of unknown anatomic location   3. [redacted] weeks gestation of pregnancy   4. Intrauterine pregnancy    Allergies as of 08/15/2018      Reactions   Darvocet [propoxyphene N-acetaminophen] Anaphylaxis   Latex Anaphylaxis      Medication List    TAKE these medications   albuterol 108 (90 Base) MCG/ACT inhaler Commonly known as:  PROVENTIL HFA;VENTOLIN HFA Inhale 2 puffs into the lungs every 6 (six) hours as needed for wheezing or shortness of breath.   cefadroxil 500 MG capsule Commonly known as:  DURICEF Take 1 capsule (500 mg total) by mouth 2 (two) times daily for 7 days.   prenatal multivitamin Tabs tablet Take 1 tablet by mouth daily at 12 noon.      -discussed sitz baths and warm compresses -diuscussed s/sx of worsening abscess/return MAU precautions -keep appt with OB for 08/20/2018 and have abscess evaluated at office visit -pt discharged to home in stable condition  Elmyra Ricks E Elray Dains 08/15/2018, 1:54 PM

## 2018-08-15 NOTE — Discharge Instructions (Signed)
How to Take a Sitz Bath  A sitz bath is a warm water bath that may be used to care for your rectum, genital area, or the area between your rectum and genitals (perineum). For a sitz bath, the water only comes up to your hips and covers your buttocks. A sitz bath may done at home in a bathtub or with a portable sitz bath that fits over the toilet.  Your health care provider may recommend a sitz bath to help:   Relieve pain and discomfort after delivering a baby.   Relieve pain and itching from hemorrhoids or anal fissures.   Relieve pain after certain surgeries.   Relax muscles that are sore or tight.  How to take a sitz bath  Take 3-4 sitz baths a day, or as many as told by your health care provider.  Bathtub sitz bath  To take a sitz bath in a bathtub:  1. Partially fill a bathtub with warm water. The water should be deep enough to cover your hips and buttocks when you are sitting in the tub.  2. If your health care provider told you to put medicine in the water, follow his or her instructions.  3. Sit in the water.  4. Open the tub drain a little, and leave it open during your bath.  5. Turn on the warm water again, enough to replace the water that is draining out. Keep the water running throughout your bath. This helps keep the water at the right level and the right temperature.  6. Soak in the water for 15-20 minutes, or as long as told by your health care provider.  7. When you are done, be careful when you stand up. You may feel dizzy.  8. After the sitz bath, pat yourself dry. Do not rub your skin to dry it.    Over-the-toilet sitz bath  To take a sitz bath with an over-the-toilet basin:  1. Follow the manufacturer's instructions.  2. Fill the basin with warm water.  3. If your health care provider told you to put medicine in the water, follow his or her instructions.  4. Sit on the seat. Make sure the water covers your buttocks and perineum.  5. Soak in the water for 15-20 minutes, or as long as told by  your health care provider.  6. After the sitz bath, pat yourself dry. Do not rub your skin to dry it.  7. Clean and dry the basin between uses.  8. Discard the basin if it cracks, or according to the manufacturer's instructions.  Contact a health care provider if:   Your symptoms get worse. Do not continue with sitz baths if your symptoms get worse.   You have new symptoms. If this happens, do not continue with sitz baths until you talk with your health care provider.  Summary   A sitz bath is a warm water bath in which the water only comes up to your hips and covers your buttocks.   A sitz bath may help relieve itching, relieve pain, and relax muscles that are sore or tight in the lower part of your body, including your genital area.   Take 3-4 sitz baths a day, or as many as told by your health care provider. Soak in the water for 15-20 minutes.   Do not continue with sitz baths if your symptoms get worse.  This information is not intended to replace advice given to you by your health care provider. Make 

## 2018-08-16 LAB — GC/CHLAMYDIA PROBE AMP (~~LOC~~) NOT AT ARMC
Chlamydia: NEGATIVE
Neisseria Gonorrhea: NEGATIVE

## 2018-08-16 LAB — CULTURE, OB URINE

## 2019-10-31 IMAGING — US OBSTETRIC <14 WK US AND TRANSVAGINAL OB US
1 series · 15 of 28 positions shown · non-contrast
Comparison: 07/20/2018

CLINICAL DATA: Pelvic pain. 1st trimester pregnancy of unknown
anatomic location.

EXAM:
OBSTETRIC <14 WK US AND TRANSVAGINAL OB US
TECHNIQUE: Both transabdominal and transvaginal ultrasound examinations were
performed for complete evaluation of the gestation as well as the
maternal uterus, adnexal regions, and pelvic cul-de-sac.
Transvaginal technique was performed to assess early pregnancy.

[Series 1: obstetric <14 wk us and transvaginal ob us · 15 of 45 slices shown]
[im 1/45]
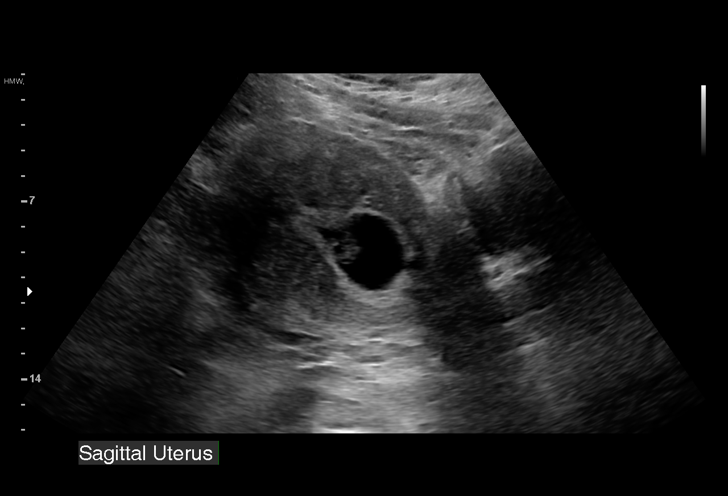
[im 4/45]
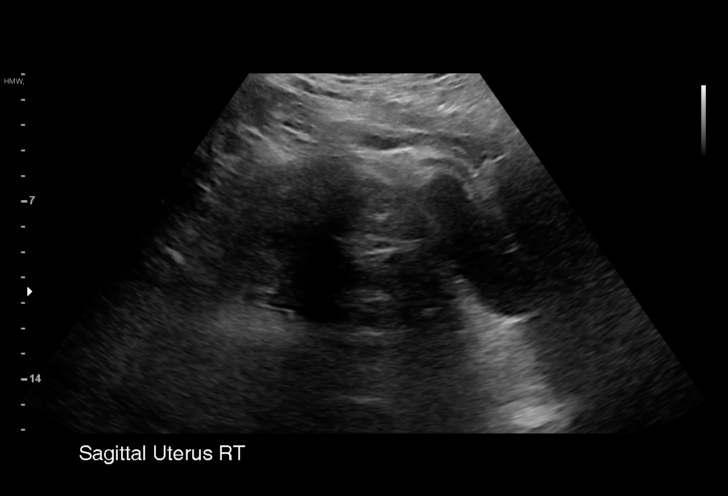
[im 7/45]
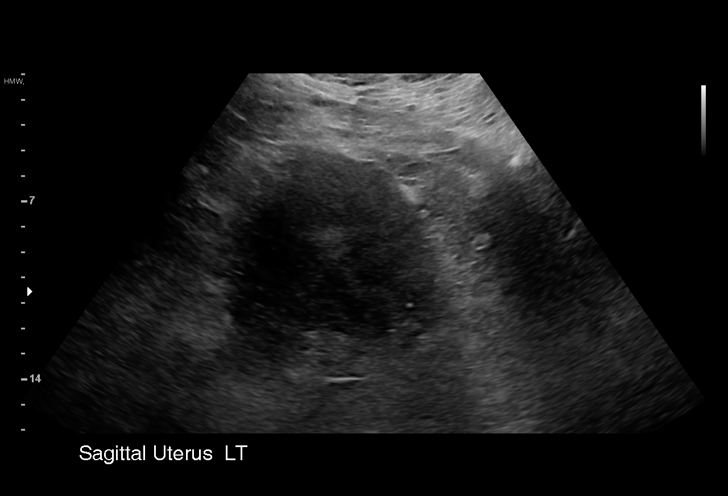
[im 10/45]
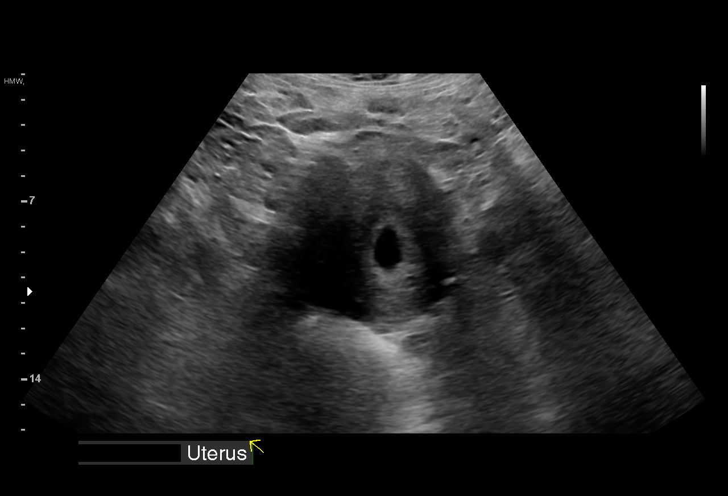
[im 14/45]
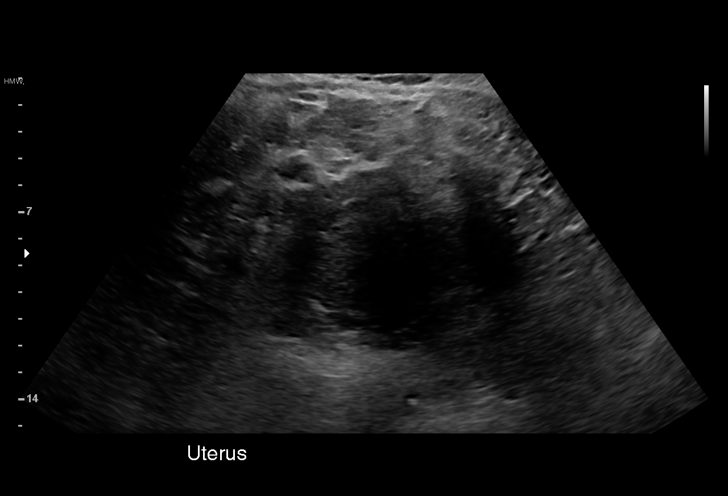
[im 17/45]
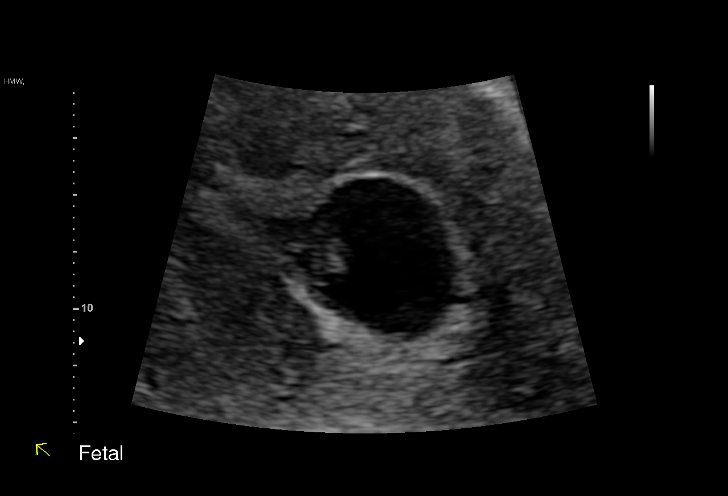
[im 20/45]
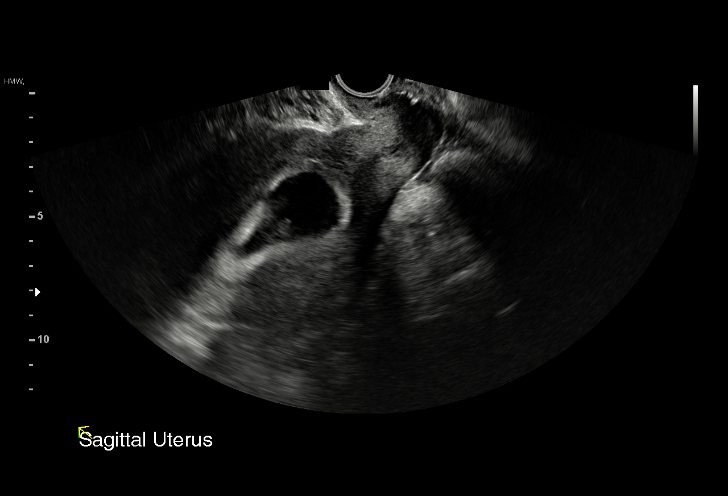
[im 23/45]
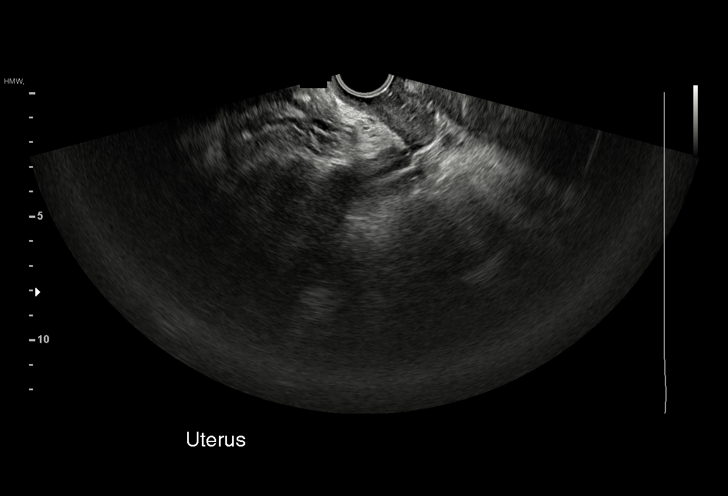
[im 25/45]
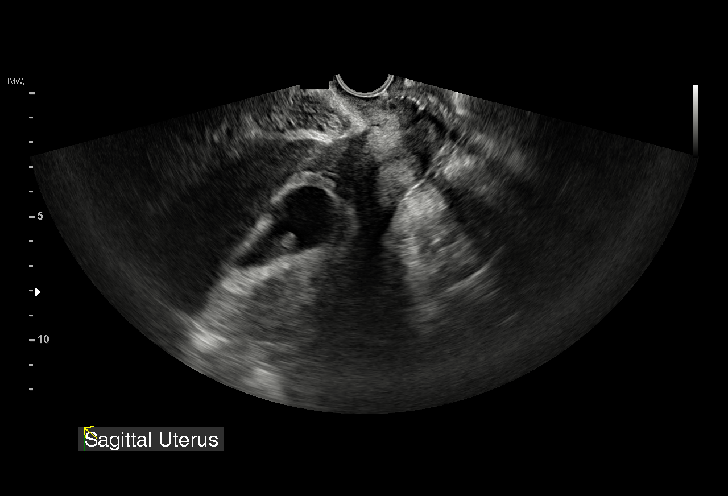
[im 28/45]
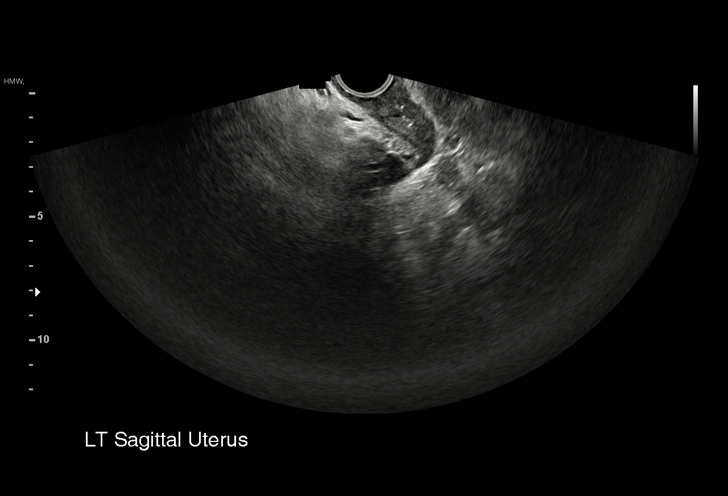
[im 31/45]
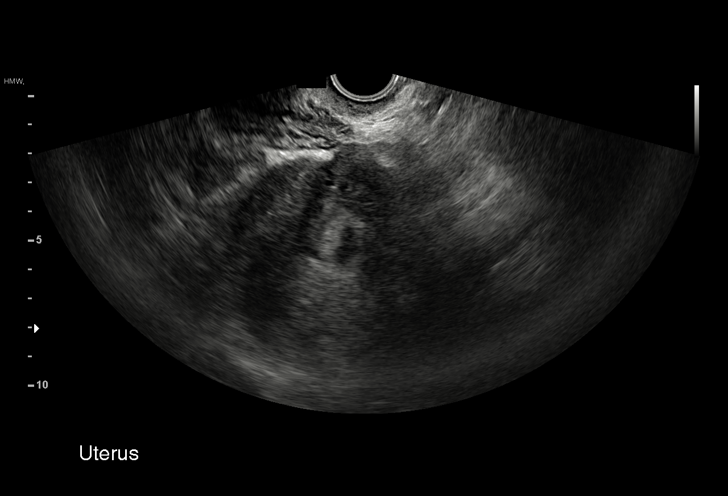
[im 35/45]
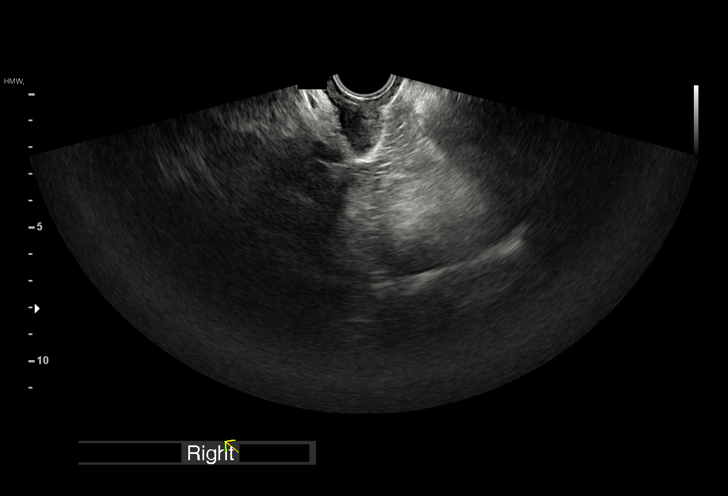
[im 38/45]
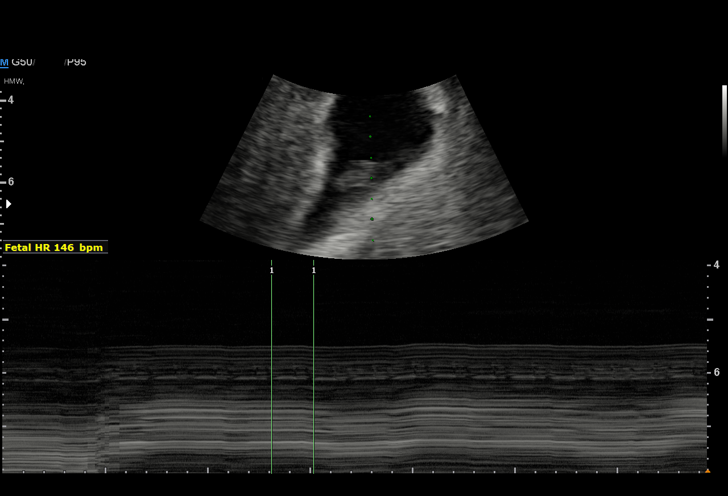
[im 41/45]
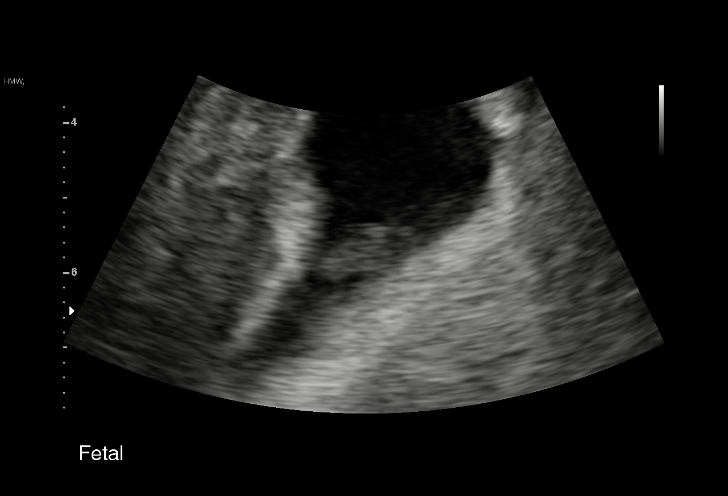
[im 45/45]
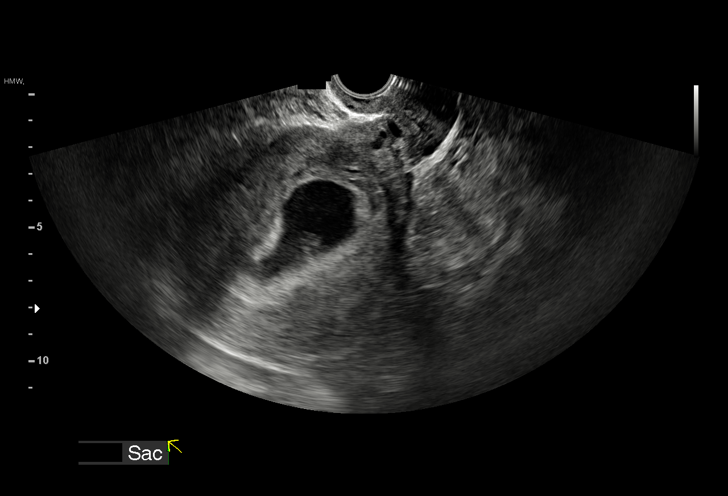

[15 of 28 positions shown; findings below may reference images not displayed]

FINDINGS: Intrauterine gestational sac: Single

Yolk sac:  Not Visualized.

Embryo:  Visualized.

Cardiac Activity: Visualized.

Heart Rate: 146 bpm

CRL:  12.6 mm   7 w   3 d                  US EDC: 03/31/2019

Subchorionic hemorrhage:  None visualized.

Maternal uterus/adnexae: Neither ovary is directly visualized on
this study, however no adnexal mass or abnormal free fluid
identified.
IMPRESSION: Single living IUP measuring 7 weeks 3 days, with US EDC of
03/31/2019.

## 2019-12-12 ENCOUNTER — Emergency Department (HOSPITAL_BASED_OUTPATIENT_CLINIC_OR_DEPARTMENT_OTHER)
Admission: EM | Admit: 2019-12-12 | Discharge: 2019-12-12 | Disposition: A | Discharge status: Home / Self Care | Payer: BLUE CROSS/BLUE SHIELD | Attending: Emergency Medicine | Admitting: Emergency Medicine

## 2019-12-12 ENCOUNTER — Other Ambulatory Visit: Payer: Self-pay

## 2019-12-12 ENCOUNTER — Encounter (HOSPITAL_BASED_OUTPATIENT_CLINIC_OR_DEPARTMENT_OTHER): Payer: Self-pay

## 2019-12-12 DIAGNOSIS — Z5321 Procedure and treatment not carried out due to patient leaving prior to being seen by health care provider: Secondary | ICD-10-CM | POA: Diagnosis not present

## 2019-12-12 DIAGNOSIS — Z202 Contact with and (suspected) exposure to infections with a predominantly sexual mode of transmission: Secondary | ICD-10-CM | POA: Diagnosis present

## 2019-12-12 HISTORY — DX: Essential (primary) hypertension: I10
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
## 2021-11-16 ENCOUNTER — Encounter (HOSPITAL_COMMUNITY): Payer: Self-pay

## 2021-11-16 ENCOUNTER — Ambulatory Visit (HOSPITAL_COMMUNITY)
Admission: RE | Admit: 2021-11-16 | Discharge: 2021-11-16 | Disposition: A | Discharge status: Home / Self Care | Payer: Medicaid Other | Source: Ambulatory Visit | Attending: Student | Admitting: Student

## 2021-11-16 ENCOUNTER — Other Ambulatory Visit: Payer: Self-pay

## 2021-11-16 VITALS — BP 141/111 | HR 91 | Temp 98.0°F | Resp 20

## 2021-11-16 DIAGNOSIS — N760 Acute vaginitis: Secondary | ICD-10-CM

## 2021-11-16 DIAGNOSIS — Z3202 Encounter for pregnancy test, result negative: Secondary | ICD-10-CM

## 2021-11-16 DIAGNOSIS — Z113 Encounter for screening for infections with a predominantly sexual mode of transmission: Secondary | ICD-10-CM | POA: Diagnosis present

## 2021-11-16 LAB — POC URINE PREG, ED: Preg Test, Ur: NEGATIVE

## 2021-11-16 LAB — HIV ANTIBODY (ROUTINE TESTING W REFLEX): HIV Screen 4th Generation wRfx: NONREACTIVE

## 2021-11-16 MED ORDER — FLUCONAZOLE 150 MG PO TABS: 150.0000 mg | ORAL_TABLET | Freq: Every day | ORAL | 0 refills | Status: AC

## 2021-11-16 MED ORDER — CLINDAMYCIN HCL 300 MG PO CAPS: 300.0000 mg | ORAL_CAPSULE | Freq: Two times a day (BID) | ORAL | 0 refills | Status: AC

## 2021-11-16 NOTE — ED Triage Notes (Signed)
T reports she may have a vag yeast infection and wants STD testing

## 2021-11-16 NOTE — Discharge Instructions (Addendum)
-  For your yeast infection, start the Diflucan (fluconazole)- Take one pill today (day 1). If you're still having symptoms in 3 days, take the second pill.  -Clindamycin twice daily x7 days. Take with food if sensitive stomach. -We have sent testing for sexually transmitted infections. We will notify you of any positive results once they are received. If required, we will prescribe any medications you might need. Please refrain from all sexual activity until treatment is complete.  -Seek additional medical attention if you develop fevers/chills, new/worsening abdominal pain, new/worsening vaginal discomfort/discharge, etc.

## 2021-11-16 NOTE — ED Provider Notes (Signed)
[place]    CSN: [id number] Arrival date & time: 11/16/21  1633      History   Chief Complaint Chief Complaint  Patient presents with   Vaginal Itching    I think I have BV or early stages of a yeast infection. I would also like STD testing - Entered by patient   Vaginal Discharge   Exposure to STD    HPI Alexis Blake is a 36 y.o. female presenting with vaginal irritation x4 days, concern for BV or yeast. History BV. States same female partner but he may not be monogamous. Denies hematuria, dysuria, frequency, urgency, back pain, n/v/d/abd pain, fevers/chills, abdnormal vaginal discharge, vaginal odor, vaginal rash/lesion.    HPI  Past Medical History:  Diagnosis Date   Asthma    Hypertension    Obesity    Vaginal Pap smear, abnormal     There are no problems to display for this patient.   Past Surgical History:  Procedure Laterality Date   BARIATRIC SURGERY     CESAREAN SECTION     C/S x 2   CHOLECYSTECTOMY     LEEP      OB History     Gravida  3   Para  2   Term  2   Preterm      AB      Living  2      SAB      IAB      Ectopic      Multiple      Live Births  2            Home Medications    Prior to Admission medications   Medication Sig Start Date End Date Taking? Authorizing Provider  clindamycin (CLEOCIN) 300 MG capsule Take 1 capsule (300 mg total) by mouth 2 (two) times daily for 7 days. 11/16/21 11/23/21 Yes Hazel Sams, PA-C  fluconazole (DIFLUCAN) 150 MG tablet Take 1 tablet (150 mg total) by mouth daily. -For your yeast infection, start the Diflucan (fluconazole)- Take one pill today (day 1). If you're still having symptoms in 3 days, take the second pill. 11/16/21  Yes Hazel Sams, PA-C  albuterol (PROVENTIL HFA;VENTOLIN HFA) 108 (90 Base) MCG/ACT inhaler Inhale 2 puffs into the lungs every 6 (six) hours as needed for wheezing or shortness of breath.    [provider]  hydrochlorothiazide  (MICROZIDE) 12.5 MG capsule TAKE 1 CAPSULE(12.5 MG) BY MOUTH DAILY 09/16/19   [provider]  levonorgestrel (MIRENA, 52 MG,) 20 MCG/24HR IUD Mirena 20 mcg/24 hours (6 yrs) 52 mg intrauterine device  Take by intrauterine route.    [provider]  Prenatal Vit-Fe Fumarate-FA (PRENATAL MULTIVITAMIN) TABS tablet Take 1 tablet by mouth daily at 12 noon.    [provider]    Family History Family History  Problem Relation Age of Onset   Alcohol abuse Neg Hx    Arthritis Neg Hx    Asthma Neg Hx    Birth defects Neg Hx    Cancer Neg Hx    COPD Neg Hx    Depression Neg Hx    Diabetes Neg Hx    Drug abuse Neg Hx    Early death Neg Hx    Hearing loss Neg Hx    Heart disease Neg Hx    Hyperlipidemia Neg Hx    Hypertension Neg Hx    Kidney disease Neg Hx    Learning disabilities Neg Hx  Mental illness Neg Hx    Mental retardation Neg Hx    Miscarriages / Stillbirths Neg Hx    Stroke Neg Hx    Vision loss Neg Hx    Varicose Veins Neg Hx     Social History Social History   Tobacco Use   Smoking status: Never   Smokeless tobacco: Never  Vaping Use   Vaping Use: Never used  Substance Use Topics   Alcohol use: Not Currently    Alcohol/week: 2.0 standard drinks of alcohol    Types: 2 Standard drinks or equivalent per week    Comment: occ   Drug use: No     Allergies   Darvocet [propoxyphene n-acetaminophen], Latex, Other, Propoxyphene, and Metronidazole   Review of Systems Review of Systems  Constitutional:  Negative for chills and fever.  HENT:  Negative for sore throat.   Eyes:  Negative for pain and redness.  Respiratory:  Negative for shortness of breath.   Cardiovascular:  Negative for chest pain.  Gastrointestinal:  Negative for abdominal pain, diarrhea, nausea and vomiting.  Genitourinary:  Negative for decreased urine volume, difficulty urinating, dysuria, flank pain, frequency, genital sores, hematuria, urgency, vaginal bleeding,  vaginal discharge and vaginal pain.  Musculoskeletal:  Negative for back pain.  Skin:  Negative for rash.     Physical Exam Triage Vital Signs ED Triage Vitals  Enc Vitals Group     BP 11/16/21 1710 (!) 141/111     Pulse Rate 11/16/21 1710 91     Resp 11/16/21 1710 20     Temp 11/16/21 1710 98 F (36.7 C)     Temp src --      SpO2 11/16/21 1710 97 %     Weight --      Height --      Head Circumference --      Peak Flow --      Pain Score 11/16/21 1708 5     Pain Loc --      Pain Edu? --      Excl. in [place]? --    No data found.  Updated Vital Signs BP (!) 141/111   Pulse 91   Temp 98 F (36.7 C)   Resp 20   LMP  (LMP Unknown)   SpO2 97%   Visual Acuity Right Eye Distance:   Left Eye Distance:   Bilateral Distance:    Right Eye Near:   Left Eye Near:    Bilateral Near:     Physical Exam Vitals reviewed.  Constitutional:      General: She is not in acute distress.    Appearance: Normal appearance. She is not ill-appearing.  HENT:     Head: Normocephalic and atraumatic.     Mouth/Throat:     Mouth: Mucous membranes are moist.     Comments: Moist mucous membranes Eyes:     Extraocular Movements: Extraocular movements intact.     Pupils: Pupils are equal, round, and reactive to light.  Cardiovascular:     Rate and Rhythm: Normal rate and regular rhythm.     Heart sounds: Normal heart sounds.  Pulmonary:     Effort: Pulmonary effort is normal.     Breath sounds: Normal breath sounds. No wheezing, rhonchi or rales.  Abdominal:     General: Bowel sounds are normal. There is no distension.     Palpations: Abdomen is soft. There is no mass.     Tenderness: There is no abdominal tenderness. There is  no right CVA tenderness, left CVA tenderness, guarding or rebound.  Skin:    General: Skin is warm.     Capillary Refill: Capillary refill takes less than 2 seconds.     Comments: Good skin turgor  Neurological:     General: No focal deficit present.     Mental  Status: She is alert and oriented to person, place, and time.  Psychiatric:        Mood and Affect: Mood normal.        Behavior: Behavior normal.      UC Treatments / Results  Labs (all labs ordered are listed, but only abnormal results are displayed) Labs Reviewed  RPR  HIV ANTIBODY (ROUTINE TESTING W REFLEX)  POC URINE PREG, ED  CERVICOVAGINAL ANCILLARY ONLY    EKG   Radiology No results found.  Procedures Procedures (including critical care time)  Medications Ordered in UC Medications - No data to display  Initial Impression / Assessment and Plan / UC Course  I have reviewed the triage vital signs and the nursing notes.  Pertinent labs & imaging results that were available during my care of the patient were reviewed by me and considered in my medical decision making (see chart for details).     This patient is a very pleasant 36 y.o. year old female presenting with vaginitis. Afebrile, nontachycardic, no reproducible abd pain or CVAT.  Suspect BV. Will send self-swab for G/C, trich, yeast, BV testing. Also sent HIV, RPR. Safe sex precautions.   Given history recurrent yeast and BV, will proceed with diflucan. She is metronidazole allergic but tolerates clindamycin PO. Sent as below.  ED return precautions discussed. Patient verbalizes understanding and agreement.     Final Clinical Impressions(s) / UC Diagnoses   Final diagnoses:  Vaginitis and vulvovaginitis  Negative pregnancy test  Routine screening for STI (sexually transmitted infection)     Discharge Instructions      -For your yeast infection, start the Diflucan (fluconazole)- Take one pill today (day 1). If you're still having symptoms in 3 days, take the second pill.  -Clindamycin twice daily x7 days. Take with food if sensitive stomach. -We have sent testing for sexually transmitted infections. We will notify you of any positive results once they are received. If required, we will prescribe  any medications you might need. Please refrain from all sexual activity until treatment is complete.  -Seek additional medical attention if you develop fevers/chills, new/worsening abdominal pain, new/worsening vaginal discomfort/discharge, etc.    ED Prescriptions     Medication Sig Dispense Auth. Provider   fluconazole (DIFLUCAN) 150 MG tablet Take 1 tablet (150 mg total) by mouth daily. -For your yeast infection, start the Diflucan (fluconazole)- Take one pill today (day 1). If you're still having symptoms in 3 days, take the second pill. 2 tablet Hazel Sams, PA-C   clindamycin (CLEOCIN) 300 MG capsule Take 1 capsule (300 mg total) by mouth 2 (two) times daily for 7 days. 14 capsule Hazel Sams, PA-C      PDMP not reviewed this encounter.   Hazel Sams, PA-C 11/16/21 1815

## 2021-11-17 ENCOUNTER — Encounter (HOSPITAL_COMMUNITY): Payer: Self-pay

## 2021-11-17 ENCOUNTER — Ambulatory Visit (HOSPITAL_COMMUNITY)
Admission: EM | Admit: 2021-11-17 | Discharge: 2021-11-17 | Disposition: A | Discharge status: Home Health | Payer: BLUE CROSS/BLUE SHIELD | Attending: Internal Medicine | Admitting: Internal Medicine

## 2021-11-17 DIAGNOSIS — A599 Trichomoniasis, unspecified: Secondary | ICD-10-CM | POA: Diagnosis not present

## 2021-11-17 LAB — CERVICOVAGINAL ANCILLARY ONLY

## 2021-11-17 LAB — RPR: RPR Ser Ql: NONREACTIVE

## 2021-11-17 NOTE — ED Triage Notes (Signed)
Pt presents for vaginal swab recollect.

## 2021-11-18 ENCOUNTER — Telehealth (HOSPITAL_COMMUNITY): Payer: Self-pay | Admitting: Emergency Medicine

## 2021-11-18 LAB — CERVICOVAGINAL ANCILLARY ONLY
Bacterial Vaginitis (gardnerella): NEGATIVE
Candida Glabrata: NEGATIVE
Candida Vaginitis: NEGATIVE
Chlamydia: NEGATIVE
Comment: NEGATIVE
Comment: NEGATIVE
Comment: NEGATIVE
Comment: NEGATIVE
Comment: NEGATIVE
Comment: NORMAL
Neisseria Gonorrhea: NEGATIVE
Trichomonas: POSITIVE — AB

## 2021-11-18 MED ORDER — METRONIDAZOLE 500 MG PO TABS: 500.0000 mg | ORAL_TABLET | Freq: Two times a day (BID) | ORAL | 0 refills | Status: AC

## 2023-08-13 ENCOUNTER — Emergency Department (HOSPITAL_COMMUNITY)
Admission: EM | Admit: 2023-08-13 | Discharge: 2023-08-13 | Disposition: A | Discharge status: Home / Self Care | Attending: Emergency Medicine | Admitting: Emergency Medicine

## 2023-08-13 ENCOUNTER — Emergency Department (HOSPITAL_COMMUNITY)

## 2023-08-13 ENCOUNTER — Other Ambulatory Visit: Payer: Self-pay

## 2023-08-13 ENCOUNTER — Encounter (HOSPITAL_COMMUNITY): Payer: Self-pay

## 2023-08-13 DIAGNOSIS — B9689 Other specified bacterial agents as the cause of diseases classified elsewhere: Secondary | ICD-10-CM | POA: Diagnosis not present

## 2023-08-13 DIAGNOSIS — I10 Essential (primary) hypertension: Secondary | ICD-10-CM | POA: Insufficient documentation

## 2023-08-13 DIAGNOSIS — J45909 Unspecified asthma, uncomplicated: Secondary | ICD-10-CM | POA: Insufficient documentation

## 2023-08-13 DIAGNOSIS — Z91040 Latex allergy status: Secondary | ICD-10-CM | POA: Diagnosis not present

## 2023-08-13 DIAGNOSIS — Z79899 Other long term (current) drug therapy: Secondary | ICD-10-CM | POA: Diagnosis not present

## 2023-08-13 DIAGNOSIS — N760 Acute vaginitis: Secondary | ICD-10-CM | POA: Diagnosis not present

## 2023-08-13 DIAGNOSIS — R1031 Right lower quadrant pain: Secondary | ICD-10-CM | POA: Diagnosis present

## 2023-08-13 DIAGNOSIS — R109 Unspecified abdominal pain: Secondary | ICD-10-CM

## 2023-08-13 LAB — URINALYSIS, ROUTINE W REFLEX MICROSCOPIC
Bilirubin Urine: NEGATIVE
Glucose, UA: NEGATIVE mg/dL
Ketones, ur: NEGATIVE mg/dL
Leukocytes,Ua: NEGATIVE
Nitrite: NEGATIVE
Protein, ur: NEGATIVE mg/dL
Specific Gravity, Urine: 1.027 (ref 1.005–1.030)
pH: 5 (ref 5.0–8.0)

## 2023-08-13 LAB — CBC
HCT: 42.3 % (ref 36.0–46.0)
Hemoglobin: 14.1 g/dL (ref 12.0–15.0)
MCH: 29.3 pg (ref 26.0–34.0)
MCHC: 33.3 g/dL (ref 30.0–36.0)
MCV: 87.8 fL (ref 80.0–100.0)
Platelets: 300 10*3/uL (ref 150–400)
RBC: 4.82 MIL/uL (ref 3.87–5.11)
RDW: 14 % (ref 11.5–15.5)
WBC: 5.7 10*3/uL (ref 4.0–10.5)
nRBC: 0 % (ref 0.0–0.2)

## 2023-08-13 LAB — COMPREHENSIVE METABOLIC PANEL WITH GFR
ALT: 50 U/L — ABNORMAL HIGH (ref 0–44)
AST: 35 U/L (ref 15–41)
Albumin: 3.8 g/dL (ref 3.5–5.0)
Alkaline Phosphatase: 51 U/L (ref 38–126)
Anion gap: 10 (ref 5–15)
BUN: 9 mg/dL (ref 6–20)
CO2: 20 mmol/L — ABNORMAL LOW (ref 22–32)
Calcium: 9.2 mg/dL (ref 8.9–10.3)
Chloride: 109 mmol/L (ref 98–111)
Creatinine, Ser: 1.01 mg/dL — ABNORMAL HIGH (ref 0.44–1.00)
GFR, Estimated: >60 mL/min (ref >60)
Glucose, Bld: 87 mg/dL (ref 70–99)
Potassium: 4 mmol/L (ref 3.5–5.1)
Sodium: 139 mmol/L (ref 135–145)
Total Bilirubin: 0.8 mg/dL (ref 0.0–1.2)
Total Protein: 7.4 g/dL (ref 6.5–8.1)

## 2023-08-13 LAB — WET PREP, GENITAL
Sperm: NONE SEEN
Trich, Wet Prep: NONE SEEN
WBC, Wet Prep HPF POC: >=10 — AB (ref <10)
Yeast Wet Prep HPF POC: NONE SEEN

## 2023-08-13 LAB — LIPASE, BLOOD: Lipase: 26 U/L (ref 11–51)

## 2023-08-13 LAB — HCG, SERUM, QUALITATIVE: Preg, Serum: NEGATIVE

## 2023-08-13 LAB — HIV ANTIBODY (ROUTINE TESTING W REFLEX): HIV Screen 4th Generation wRfx: NONREACTIVE

## 2023-08-13 MED ORDER — IOHEXOL 350 MG/ML SOLN
75.0000 mL | Freq: Once | INTRAVENOUS | Status: AC | PRN
  Administered 2023-08-13: 75 mL via INTRAVENOUS

## 2023-08-13 MED ORDER — METRONIDAZOLE 500 MG PO TABS: 500.0000 mg | ORAL_TABLET | Freq: Two times a day (BID) | ORAL | 0 refills | Status: AC

## 2023-08-13 NOTE — ED Notes (Signed)
 Pt in [place]

## 2023-08-13 NOTE — ED Provider Notes (Signed)
 [place] EMERGENCY DEPARTMENT AT [facility] Provider Note   CSN: [id number] Arrival date & time: 08/13/23  1218     History  Chief Complaint  Patient presents with   Abdominal Pain    Alexis Blake is a 38 y.o. female with PMHx asthma, HTN who presents to ED concerned for 3 weeks of worsening RLQ pain. Also endorses brownish vaginal discharge during this time. Denies nausea, vomiting, diarrhea, dysuria, hematuria, hematochezia, fever. Denies dyspareunia. LMP 3 weeks ago.   Abdominal Pain      Home Medications Prior to Admission medications   Medication Sig Start Date End Date Taking? Authorizing Provider  albuterol (PROVENTIL HFA;VENTOLIN HFA) 108 (90 Base) MCG/ACT inhaler Inhale 2 puffs into the lungs every 6 (six) hours as needed for wheezing or shortness of breath.    [provider]  fluconazole (DIFLUCAN) 150 MG tablet Take 1 tablet (150 mg total) by mouth daily. -For your yeast infection, start the Diflucan (fluconazole)- Take one pill today (day 1). If you're still having symptoms in 3 days, take the second pill. 11/16/21   Rhys Martini, PA-C  hydrochlorothiazide (MICROZIDE) 12.5 MG capsule TAKE 1 CAPSULE(12.5 MG) BY MOUTH DAILY 09/16/19   [provider]  levonorgestrel (MIRENA, 52 MG,) 20 MCG/24HR IUD Mirena 20 mcg/24 hours (6 yrs) 52 mg intrauterine device  Take by intrauterine route.    [provider]  metroNIDAZOLE (FLAGYL) 500 MG tablet Take 1 tablet (500 mg total) by mouth 2 (two) times daily. 11/18/21   Lamptey, Britta Mccreedy, MD  Prenatal Vit-Fe Fumarate-FA (PRENATAL MULTIVITAMIN) TABS tablet Take 1 tablet by mouth daily at 12 noon.    [provider]      Allergies    Darvocet [propoxyphene n-acetaminophen], Latex, Other, and Propoxyphene    Review of Systems   Review of Systems  Gastrointestinal:  Positive for abdominal pain.    Physical Exam Updated Vital Signs BP 130/84 (BP Location: Right Arm)   Pulse 67    Temp 98.4 F (36.9 C)   Resp 16   Ht 5\' 2"  (1.575 m)   Wt (!) 141.5 kg   SpO2 97%   BMI 57.07 kg/m  Physical Exam Vitals and nursing note reviewed.  Constitutional:      General: She is not in acute distress.    Appearance: She is not ill-appearing or toxic-appearing.  HENT:     Head: Normocephalic and atraumatic.     Mouth/Throat:     Mouth: Mucous membranes are moist.     Pharynx: No posterior oropharyngeal erythema.  Eyes:     General: No scleral icterus.       Right eye: No discharge.        Left eye: No discharge.     Conjunctiva/sclera: Conjunctivae normal.  Cardiovascular:     Rate and Rhythm: Normal rate and regular rhythm.     Pulses: Normal pulses.     Heart sounds: Normal heart sounds. No murmur heard. Pulmonary:     Effort: Pulmonary effort is normal. No respiratory distress.     Breath sounds: Normal breath sounds. No wheezing, rhonchi or rales.  Abdominal:     General: Abdomen is flat. Bowel sounds are normal. There is no distension.     Palpations: Abdomen is soft. There is no mass.     Tenderness: There is abdominal tenderness in the right lower quadrant.  Genitourinary:    Comments: - External: without ulcers, lesions, or lacerations. There is vaginal piercing present -  Vaginal Vault: without laceration, pooled blood, clots, foreign body - Cervix: without discoloration, friability, or masses. There is thin what vaginal discharge present.   Musculoskeletal:     Right lower leg: No edema.     Left lower leg: No edema.  Skin:    General: Skin is warm and dry.     Findings: No rash.  Neurological:     General: No focal deficit present.     Mental Status: She is alert and oriented to person, place, and time. Mental status is at baseline.  Psychiatric:        Mood and Affect: Mood normal.     ED Results / Procedures / Treatments   Labs (all labs ordered are listed, but only abnormal results are displayed) Labs Reviewed  COMPREHENSIVE METABOLIC  PANEL WITH GFR - Abnormal; Notable for the following components:      Result Value   CO2 20 (*)    Creatinine, Ser 1.01 (*)    ALT 50 (*)    All other components within normal limits  URINALYSIS, ROUTINE W REFLEX MICROSCOPIC - Abnormal; Notable for the following components:   APPearance CLOUDY (*)    Hgb urine dipstick SMALL (*)    Bacteria, UA RARE (*)    All other components within normal limits  WET PREP, GENITAL  LIPASE, BLOOD  CBC  HCG, SERUM, QUALITATIVE  RPR  HIV ANTIBODY (ROUTINE TESTING W REFLEX)  GC/CHLAMYDIA PROBE AMP ([place]) NOT AT [facility]    EKG None  Radiology No results found.  Procedures Procedures    Medications Ordered in ED Medications - No data to display  ED Course/ Medical Decision Making/ A&P                                 Medical Decision Making Amount and/or Complexity of Data Reviewed Labs: ordered. Radiology: ordered.    This patient presents to the ED for concern of abdominal pain, this involves an extensive number of treatment options, and is a complaint that carries with it a high risk of complications and morbidity.  The differential diagnosis includes gastroenteritis, colitis, small bowel obstruction, appendicitis, cholecystitis, pancreatitis, nephrolithiasis, UTI, pyleonephritis, ruptured ectopic pregnancy, PID, ovarian torsion.   Co morbidities that complicate the patient evaluation  asthma, HTN    Additional history obtained:  Dr. Kristen Loader PCP    Problem List / ED Course / Critical interventions / Medication management  Patient presented for RLQ pain. Also with vaginal discharge. Denies dyspareunia. Pelvic exam without cervical motion tenderness. There was thin, white vaginal discharge. There is also RLQ tenderness to palpation. Rest of physical exam reassuring. Patient afebrile with stable vitals. I Ordered, and personally interpreted labs.  CBC without leukocytosis or anemia.  hCG negative.  CMP with slight elevation in  creatinine at 1.01.  UA not concerning for infection.  Lipase within normal limits.  STD panel pending. I ordered imaging studies including CT Abd/Pelvis with contrast: evaluate for structural/surgical etiology of patients' severe abdominal pain.  This imaging is pending. I have reviewed the patients home medicines and have made adjustments as needed   Social Determinants of Health:  none  3PM [facility] [place] transferred to PA Zelaya at the end of my shift as the patient will require reassessment once labs/imaging have resulted. Patient presentation, ED course, and plan of care discussed with review of all pertinent labs and imaging. Please see his/her note  for further details regarding further ED course and disposition. Plan at time of handoff is reassess patient after STD panel and imaging. This may be altered or completely changed at the discretion of the oncoming team pending results of further workup.         Final Clinical Impression(s) / ED Diagnoses Final diagnoses:  None    Rx / DC Orders ED Discharge Orders     None         Dorthy Cooler, [place] 08/13/23 1511    Linwood Dibbles, MD 08/14/23 [phone]

## 2023-08-13 NOTE — Discharge Instructions (Addendum)
 You were seen in the ER today for concerns of abdominal pain. Your labs and imaging were thankfully reassuring but you did have evidence of bacterial vaginosis. I have sent a prescription for metronidazole to your pharmacy. Please take this as prescribed. Follow up with your primary care provider for further evaluation.

## 2023-08-13 NOTE — ED Provider Notes (Signed)
 Accepted handoff at shift change from [place], [place]. Please see prior provider note for more detail.   Briefly: Patient is 38 y.o.   DDX: concern for appendicitis, bowel obstruction, ovarian cyst, tubo-ovarian abscess, PID  Plan: Await results of CT abdomen pelvis for disposition and possible further imaging.   Physical Exam  BP (!) 137/95   Pulse 83   Temp 98.1 F (36.7 C) (Oral)   Resp 14   Ht 5\' 2"  (1.575 m)   Wt (!) 141.5 kg   SpO2 99%   BMI 57.07 kg/m   Physical Exam Vitals and nursing note reviewed.  Constitutional:      General: She is not in acute distress.    Appearance: She is not ill-appearing or toxic-appearing.  HENT:     Head: Normocephalic and atraumatic.     Mouth/Throat:     Mouth: Mucous membranes are moist.     Pharynx: No posterior oropharyngeal erythema.  Eyes:     General: No scleral icterus.       Right eye: No discharge.        Left eye: No discharge.     Conjunctiva/sclera: Conjunctivae normal.  Cardiovascular:     Rate and Rhythm: Normal rate and regular rhythm.     Pulses: Normal pulses.     Heart sounds: Normal heart sounds. No murmur heard. Pulmonary:     Effort: Pulmonary effort is normal. No respiratory distress.     Breath sounds: Normal breath sounds. No wheezing, rhonchi or rales.  Abdominal:     General: Abdomen is flat. Bowel sounds are normal. There is no distension.     Palpations: Abdomen is soft. There is no mass.     Tenderness: There is abdominal tenderness in the right lower quadrant.  Genitourinary:    Comments: - External: without ulcers, lesions, or lacerations. There is vaginal piercing present - Vaginal Vault: without laceration, pooled blood, clots, foreign body - Cervix: without discoloration, friability, or masses. There is thin what vaginal discharge present.   Musculoskeletal:     Right lower leg: No edema.     Left lower leg: No edema.  Skin:    General: Skin is warm and dry.     Findings: No rash.   Neurological:     General: No focal deficit present.     Mental Status: She is alert and oriented to person, place, and time. Mental status is at baseline.  Psychiatric:        Mood and Affect: Mood normal.     Procedures  Procedures  ED Course / MDM    Medical Decision Making Amount and/or Complexity of Data Reviewed Labs: ordered. Radiology: ordered.  Risk Prescription drug management.   CT imaging is reassuring and no acute abnormality seen. Will proceed with ultrasound imaging after discussion with patient persistent right sided abdominal pain.  Ultrasound imaging to rule out possible variant torsion versus other ovarian abnormalities.  Ultrasound imaging unremarkable.  Ovaries not well appreciated so cannot clearly rule out ovarian torsion.  Given patient reporting minimal pain at this time, doubtful of torsion as this is likely to cause severe pain.  Findings on workup appear to only show evidence of bacterial vaginosis.  Will treat patient with a course of metronidazole twice daily for 7 days.  Advise return precautions such as concerns for new or worsening symptoms.  Patient otherwise stable plan for outpatient follow-up with PCP.       Smitty Knudsen, PA-C 08/13/23 1824  Gerhard Munch, MD 08/16/23 [phone]

## 2023-08-13 NOTE — ED Triage Notes (Signed)
 Patient reports RLQ pain that started approx 1 week ago.  Denies urinary symptoms but does reports brown vaginal discharge.  Denies n/v/d Normal bowel movements. Denies fever.

## 2023-08-14 LAB — GC/CHLAMYDIA PROBE AMP (~~LOC~~) NOT AT ARMC
Chlamydia: NEGATIVE
Comment: NEGATIVE
Comment: NORMAL
Neisseria Gonorrhea: NEGATIVE

## 2023-08-14 LAB — RPR: RPR Ser Ql: NONREACTIVE
